# Patient Record
Sex: Female | Born: 1940 | Race: Black or African American | Hispanic: No | Smoking: Never smoker
Health system: Southern US, Community
[De-identification: ages and names within clinical notes are randomized; demographics above are authoritative.]

## PROBLEM LIST (undated history)

## (undated) DIAGNOSIS — G43909 Migraine, unspecified, not intractable, without status migrainosus: Secondary | ICD-10-CM

## (undated) DIAGNOSIS — I10 Essential (primary) hypertension: Secondary | ICD-10-CM

## (undated) DIAGNOSIS — K219 Gastro-esophageal reflux disease without esophagitis: Secondary | ICD-10-CM

## (undated) HISTORY — PX: HERNIA REPAIR: SHX51

---

## 2018-03-29 ENCOUNTER — Observation Stay (HOSPITAL_COMMUNITY): Payer: Medicare Other

## 2018-03-29 ENCOUNTER — Inpatient Hospital Stay (HOSPITAL_COMMUNITY)
Admission: EM | Admit: 2018-03-29 | Discharge: 2018-04-01 | DRG: 417 | Disposition: A | Discharge status: Home / Self Care | Payer: Medicare Other | Attending: Internal Medicine | Admitting: Internal Medicine

## 2018-03-29 ENCOUNTER — Other Ambulatory Visit: Payer: Self-pay

## 2018-03-29 ENCOUNTER — Encounter (HOSPITAL_COMMUNITY): Payer: Self-pay | Admitting: *Deleted

## 2018-03-29 ENCOUNTER — Emergency Department (HOSPITAL_COMMUNITY): Payer: Medicare Other

## 2018-03-29 DIAGNOSIS — Z7982 Long term (current) use of aspirin: Secondary | ICD-10-CM

## 2018-03-29 DIAGNOSIS — R7989 Other specified abnormal findings of blood chemistry: Secondary | ICD-10-CM

## 2018-03-29 DIAGNOSIS — G43809 Other migraine, not intractable, without status migrainosus: Secondary | ICD-10-CM

## 2018-03-29 DIAGNOSIS — K219 Gastro-esophageal reflux disease without esophagitis: Secondary | ICD-10-CM | POA: Diagnosis present

## 2018-03-29 DIAGNOSIS — B9620 Unspecified Escherichia coli [E. coli] as the cause of diseases classified elsewhere: Secondary | ICD-10-CM | POA: Diagnosis present

## 2018-03-29 DIAGNOSIS — K660 Peritoneal adhesions (postprocedural) (postinfection): Secondary | ICD-10-CM | POA: Diagnosis present

## 2018-03-29 DIAGNOSIS — K851 Biliary acute pancreatitis without necrosis or infection: Secondary | ICD-10-CM | POA: Diagnosis present

## 2018-03-29 DIAGNOSIS — K8020 Calculus of gallbladder without cholecystitis without obstruction: Principal | ICD-10-CM | POA: Diagnosis present

## 2018-03-29 DIAGNOSIS — R7881 Bacteremia: Secondary | ICD-10-CM | POA: Diagnosis present

## 2018-03-29 DIAGNOSIS — I10 Essential (primary) hypertension: Secondary | ICD-10-CM | POA: Diagnosis present

## 2018-03-29 DIAGNOSIS — E876 Hypokalemia: Secondary | ICD-10-CM | POA: Diagnosis present

## 2018-03-29 DIAGNOSIS — R52 Pain, unspecified: Secondary | ICD-10-CM

## 2018-03-29 DIAGNOSIS — Z79899 Other long term (current) drug therapy: Secondary | ICD-10-CM | POA: Diagnosis not present

## 2018-03-29 DIAGNOSIS — K859 Acute pancreatitis without necrosis or infection, unspecified: Secondary | ICD-10-CM

## 2018-03-29 DIAGNOSIS — G43909 Migraine, unspecified, not intractable, without status migrainosus: Secondary | ICD-10-CM | POA: Diagnosis present

## 2018-03-29 DIAGNOSIS — R945 Abnormal results of liver function studies: Secondary | ICD-10-CM

## 2018-03-29 HISTORY — DX: Gastro-esophageal reflux disease without esophagitis: K21.9

## 2018-03-29 HISTORY — DX: Essential (primary) hypertension: I10

## 2018-03-29 HISTORY — DX: Migraine, unspecified, not intractable, without status migrainosus: G43.909

## 2018-03-29 LAB — COMPREHENSIVE METABOLIC PANEL
ALT: 53 U/L — AB (ref 0–44)
AST: 97 U/L — ABNORMAL HIGH (ref 15–41)
Albumin: 4.3 g/dL (ref 3.5–5.0)
Alkaline Phosphatase: 48 U/L (ref 38–126)
Anion gap: 13 (ref 5–15)
BUN: 33 mg/dL — ABNORMAL HIGH (ref 8–23)
CALCIUM: 9.3 mg/dL (ref 8.9–10.3)
CHLORIDE: 102 mmol/L (ref 98–111)
CO2: 24 mmol/L (ref 22–32)
CREATININE: 0.94 mg/dL (ref 0.44–1.00)
GFR calc Af Amer: >60 mL/min (ref >60)
GFR calc non Af Amer: 56 mL/min — ABNORMAL LOW (ref >60)
Glucose, Bld: 147 mg/dL — ABNORMAL HIGH (ref 70–99)
Potassium: 4.2 mmol/L (ref 3.5–5.1)
SODIUM: 139 mmol/L (ref 135–145)
Total Bilirubin: 1.2 mg/dL (ref 0.3–1.2)
Total Protein: 7.9 g/dL (ref 6.5–8.1)

## 2018-03-29 LAB — URINALYSIS, ROUTINE W REFLEX MICROSCOPIC
Bacteria, UA: NONE SEEN
Bilirubin Urine: NEGATIVE
GLUCOSE, UA: NEGATIVE mg/dL
Ketones, ur: NEGATIVE mg/dL
Nitrite: NEGATIVE
Protein, ur: NEGATIVE mg/dL
SPECIFIC GRAVITY, URINE: 1.035 — AB (ref 1.005–1.030)
pH: 5 (ref 5.0–8.0)

## 2018-03-29 LAB — LIPASE, BLOOD: LIPASE: 1089 U/L — AB (ref 11–51)

## 2018-03-29 LAB — CBC
HCT: 35.2 % — ABNORMAL LOW (ref 36.0–46.0)
Hemoglobin: 11.9 g/dL — ABNORMAL LOW (ref 12.0–15.0)
MCH: 29.4 pg (ref 26.0–34.0)
MCHC: 33.8 g/dL (ref 30.0–36.0)
MCV: 86.9 fL (ref 78.0–100.0)
PLATELETS: 267 10*3/uL (ref 150–400)
RBC: 4.05 MIL/uL (ref 3.87–5.11)
RDW: 13.4 % (ref 11.5–15.5)
WBC: 7.2 10*3/uL (ref 4.0–10.5)

## 2018-03-29 LAB — LIPID PANEL
Cholesterol: 183 mg/dL (ref 0–200)
HDL: 77 mg/dL (ref >40)
LDL CALC: 103 mg/dL — AB (ref 0–99)
TRIGLYCERIDES: 17 mg/dL (ref <150)
Total CHOL/HDL Ratio: 2.4 RATIO
VLDL: 3 mg/dL (ref 0–40)

## 2018-03-29 MED ORDER — IOPAMIDOL (ISOVUE-300) INJECTION 61%
100.0000 mL | Freq: Once | INTRAVENOUS | Status: AC | PRN
  Administered 2018-03-29: 100 mL via INTRAVENOUS

## 2018-03-29 MED ORDER — SODIUM CHLORIDE 0.9 % IV BOLUS
500.0000 mL | Freq: Once | INTRAVENOUS | Status: AC
  Administered 2018-03-29: 500 mL via INTRAVENOUS

## 2018-03-29 MED ORDER — SODIUM CHLORIDE 0.9 % IV SOLN: 250.0000 mL | INTRAVENOUS | Status: DC | PRN

## 2018-03-29 MED ORDER — SODIUM CHLORIDE 0.9% FLUSH
3.0000 mL | Freq: Two times a day (BID) | INTRAVENOUS | Status: DC
  Administered 2018-03-29 – 2018-03-31 (×4): 3 mL via INTRAVENOUS

## 2018-03-29 MED ORDER — ONDANSETRON HCL 4 MG/2ML IJ SOLN
4.0000 mg | Freq: Four times a day (QID) | INTRAMUSCULAR | Status: DC | PRN
  Administered 2018-03-29 – 2018-03-30 (×3): 4 mg via INTRAVENOUS
  Filled 2018-03-29 (×3): qty 2

## 2018-03-29 MED ORDER — HYDRALAZINE HCL 20 MG/ML IJ SOLN: 10.0000 mg | Freq: Three times a day (TID) | INTRAMUSCULAR | Status: DC | PRN

## 2018-03-29 MED ORDER — SODIUM CHLORIDE 0.9 % IV BOLUS
1000.0000 mL | Freq: Once | INTRAVENOUS | Status: AC
  Administered 2018-03-29: 1000 mL via INTRAVENOUS

## 2018-03-29 MED ORDER — SODIUM CHLORIDE 0.9% FLUSH: 3.0000 mL | INTRAVENOUS | Status: DC | PRN

## 2018-03-29 MED ORDER — MORPHINE SULFATE (PF) 2 MG/ML IV SOLN
2.0000 mg | INTRAVENOUS | Status: DC | PRN
  Administered 2018-03-29 – 2018-03-30 (×3): 2 mg via INTRAVENOUS
  Filled 2018-03-29 (×3): qty 1

## 2018-03-29 MED ORDER — IOPAMIDOL (ISOVUE-300) INJECTION 61%
INTRAVENOUS | Status: AC
  Filled 2018-03-29: qty 100

## 2018-03-29 MED ORDER — ACETAMINOPHEN 650 MG RE SUPP: 650.0000 mg | Freq: Four times a day (QID) | RECTAL | Status: DC | PRN

## 2018-03-29 MED ORDER — FAMOTIDINE IN NACL 20-0.9 MG/50ML-% IV SOLN
20.0000 mg | Freq: Two times a day (BID) | INTRAVENOUS | Status: DC
  Administered 2018-03-29 – 2018-03-31 (×5): 20 mg via INTRAVENOUS
  Filled 2018-03-29 (×5): qty 50

## 2018-03-29 MED ORDER — SODIUM CHLORIDE 0.9 % IV SOLN
INTRAVENOUS | Status: DC
  Administered 2018-03-29 – 2018-03-30 (×3): via INTRAVENOUS

## 2018-03-29 MED ORDER — TRAMADOL HCL 50 MG PO TABS: 50.0000 mg | ORAL_TABLET | Freq: Four times a day (QID) | ORAL | Status: DC | PRN

## 2018-03-29 MED ORDER — ENOXAPARIN SODIUM 40 MG/0.4ML ~~LOC~~ SOLN
40.0000 mg | SUBCUTANEOUS | Status: DC
  Administered 2018-03-29: 40 mg via SUBCUTANEOUS
  Filled 2018-03-29: qty 0.4

## 2018-03-29 MED ORDER — SENNOSIDES-DOCUSATE SODIUM 8.6-50 MG PO TABS: 1.0000 | ORAL_TABLET | Freq: Every evening | ORAL | Status: DC | PRN

## 2018-03-29 MED ORDER — ACETAMINOPHEN 325 MG PO TABS
650.0000 mg | ORAL_TABLET | Freq: Four times a day (QID) | ORAL | Status: DC | PRN
  Administered 2018-03-29 – 2018-03-30 (×2): 650 mg via ORAL
  Filled 2018-03-29 (×2): qty 2

## 2018-03-29 MED ORDER — ONDANSETRON HCL 4 MG PO TABS: 4.0000 mg | ORAL_TABLET | Freq: Four times a day (QID) | ORAL | Status: DC | PRN

## 2018-03-29 NOTE — ED Notes (Signed)
Patient attempted to give urine sample but missed the hat entirely. Still awaiting urine sample.

## 2018-03-29 NOTE — ED Notes (Signed)
Pt had to use the restroom prior to triage, ambulatory independently for the same.

## 2018-03-29 NOTE — H&P (Addendum)
History and Physical  Kindle Strohmeier ZOX:[id number] DOB: 11-Jul-1941 DOA: 03/29/2018    PCP: System, Pcp Not In  Patient coming from: Home  Chief Complaint: Abdominal pain and vomiting  HPI: Jody Cordova is a 77 y.o. female with medical history significant for hypertension, migraine, GERD presents to the ED complaining of abdominal pain and non-bloody vomiting for the past 1 day.  Abdominal pain is located in the epigastric region, sharp, radiating to the back with associated nausea and nonbloody vomiting.  Patient has never had this issue before.  Patient is from [place] visiting her daughter here.  Patient denies any alcohol use.  Is any chest pain, shortness of breath, fever/chills, dizziness.   ED Course: Vital signs remained stable, lipase elevated 1089, AST 97, ALT 53.  CT abdomen showed acute pancreatitis without peripancreatic fluid collection or evidence of pancreatic necrosis.  Review of Systems: Review of systems are otherwise negative   Past Medical History:  Diagnosis Date  . GERD (gastroesophageal reflux disease)   . Hypertension   . Migraine     Social History:  reports that she has never smoked. She does not have any smokeless tobacco history on file. She reports that she does not drink alcohol or use drugs.   No Known Allergies  No family history on file.    Prior to Admission medications   Medication Sig Start Date End Date Taking? Authorizing Provider  alendronate (FOSAMAX) 70 MG tablet Take 70 mg by mouth every Monday. Take with a full glass of water on an empty stomach.   Yes [provider]  alum & mag hydroxide-simeth (MAALOX/MYLANTA) 200-200-20 MG/5ML suspension Take 30 mLs by mouth every 6 (six) hours as needed for indigestion or heartburn.   Yes [provider]  aspirin EC 81 MG tablet Take 81 mg by mouth daily.   Yes [provider]  cetirizine (ZYRTEC) 10 MG tablet Take 10 mg by mouth daily.   Yes [provider]  fluticasone (FLONASE) 50 MCG/ACT nasal spray Place 1 spray into both nostrils daily as needed for allergies or rhinitis.   Yes [provider]  hydrOXYzine (ATARAX/VISTARIL) 25 MG tablet Take 25 mg by mouth 2 (two) times daily as needed for anxiety or itching.   Yes [provider]  lisinopril-hydrochlorothiazide (PRINZIDE,ZESTORETIC) 20-25 MG tablet Take 1 tablet by mouth daily.   Yes [provider]  meclizine (ANTIVERT) 12.5 MG tablet Take 12.5 mg by mouth 3 (three) times daily as needed for dizziness.   Yes [provider]  pantoprazole (PROTONIX) 40 MG tablet Take 40 mg by mouth daily.   Yes [provider]  verapamil (CALAN) 40 MG tablet Take 40 mg by mouth daily.   Yes [provider]    Physical Exam: BP 125/80 (BP Location: Right Arm)   Pulse 92   Temp 98.4 F (36.9 C) (Oral)   Resp 18   Ht 5\' 7"  (1.702 m)   Wt 84.5 kg   SpO2 99%   BMI 29.16 kg/m   General: NAD Eyes: Normal ENT: Normal Neck: Supple Cardiovascular: S1, S2 present Respiratory: CTAB Abdomen: Soft, currently nontender, nondistended, bowel sounds present Skin: Normal Musculoskeletal: No pedal edema bilaterally Psychiatric: Normal mood Neurologic: No focal neurologic deficits noted          Labs on Admission:  Basic Metabolic Panel: Recent Labs  Lab 03/29/18 0253  NA 139  K 4.2  CL 102  CO2 24  GLUCOSE 147*  BUN 33*  CREATININE 0.94  CALCIUM 9.3   Liver Function Tests: Recent Labs  Lab 03/29/18 0253  AST 97*  ALT 53*  ALKPHOS 48  BILITOT 1.2  PROT 7.9  ALBUMIN 4.3   Recent Labs  Lab 03/29/18 0253  LIPASE 1,089*   No results for input(s): AMMONIA in the last 168 hours. CBC: Recent Labs  Lab 03/29/18 0253  WBC 7.2  HGB 11.9*  HCT 35.2*  MCV 86.9  PLT 267   Cardiac Enzymes: No results for input(s): CKTOTAL, CKMB, CKMBINDEX, TROPONINI in the last 168 hours.  BNP (last 3 results) No results for input(s): BNP in the  last 8760 hours.  ProBNP (last 3 results) No results for input(s): PROBNP in the last 8760 hours.  CBG: No results for input(s): GLUCAP in the last 168 hours.  Radiological Exams on Admission: Ct Abdomen Pelvis W Contrast  Result Date: 03/29/2018 CLINICAL DATA:  Acute abdominal pain.  Nausea and vomiting. EXAM: CT ABDOMEN AND PELVIS WITH CONTRAST TECHNIQUE: Multidetector CT imaging of the abdomen and pelvis was performed using the standard protocol following bolus administration of intravenous contrast. CONTRAST:  ISOVUE-300 IOPAMIDOL (ISOVUE-300) INJECTION 61% COMPARISON:  None. FINDINGS: Lower chest: Mild right lower lobe bronchiectasis and basilar scarring. No consolidation. No pleural fluid. Hepatobiliary: Few scattered subcentimeter hepatic hypodensities are too small to accurately characterize. Mild gallbladder distention with layering hyperdensity, small stones versus sludge. No biliary dilatation. Pancreas: Mild peripancreatic fat stranding about the pancreatic head. No evidence pancreatic necrosis, ductal dilatation, or acute peripancreatic fluid collection. Spleen: Normal in size without focal abnormality. Adrenals/Urinary Tract: 3.4 cm low-density left adrenal nodule consistent with adenoma. Right adrenal gland is normal. No hydronephrosis or perinephric edema. Early excretion of IV contrast in the renal collecting systems bilaterally. Urinary bladder is physiologically distended. No bladder wall thickening. Stomach/Bowel: Small hiatal hernia. Detailed bowel evaluation limited in the absence of enteric contrast. Multifocal colonic diverticulosis without diverticulitis. Normal appendix. No small bowel dilatation or inflammation. Vascular/Lymphatic: Minimal aortic atherosclerosis without aneurysm. Splenic vein is patent. No enlarged abdominal or pelvic lymph nodes. Reproductive: Post hysterectomy. Ovaries tentatively identified and quiescent. No suspicious adnexal mass. Other: No free air,  free fluid, or intra-abdominal fluid collection. Musculoskeletal: Scoliotic curvature in degenerative change in the spine. IMPRESSION: 1. Acute pancreatitis without peripancreatic fluid collection or evidence of pancreatic necrosis. 2. Layering stones or sludge in the gallbladder. 3. Multifocal colonic diverticulosis without diverticulitis. 4. Small hiatal hernia.  Incidental left adrenal adenoma. Electronically Signed   By: Narda Rutherford M.D.   On: 03/29/2018 05:39    EKG: Pending  Assessment/Plan Present on Admission: . Pancreatitis  Principal Problem:   Pancreatitis Active Problems:   GERD (gastroesophageal reflux disease)   Essential hypertension   Migraine  Acute pancreatitis Lipase elevated at 1089, AST 97, ALT 53 Denies any alcohol use, ??On HCTZ, lipid panel pending CT abdomen showed acute pancreatitis without peripancreatic fluid collection or evidence of pancreatic necrosis.  Mild gallbladder distention with layering hyperdensity, small stones versus sludge.  No biliary dilatation. Will order ultrasound for further evaluation Aggressive IV fluids Hold hydrochlorothiazide Pain management  GERD Continue IV Pepcid  Hypertension Stable Hold home lisinopril, hydrochlorothiazide (may consider stopping it upon d/c if no other cause of pancreatitis is found)  Migraine Hold home verapamil for now as patient is n.p.o.     DVT prophylaxis: Lovenox  Code Status: Full  Family Communication: None at bedside  Disposition Plan: Home  Consults called: None  Admission status: Observation  Briant Cedar MD [facility]   If 7PM-7AM, please contact night-coverage [email or web address]   03/29/2018, 7:59 AM

## 2018-03-29 NOTE — Plan of Care (Addendum)
  Discussed case with Dr. Judd Lien. Jody Cordova is a 77 year old female with pmh HTN, migraine, and GERD; who presented with abdominal pain and vomiting.  Labs revealed lipase 1089, AST 97, ALT 53.  CT scan of the abdomen and pelvis revealed acute pancreatitis without signs of fluid collection and layering stones or sludge in gallbladder.  Patient was given 500 mL normal saline IV fluids.  Accepted as observation to telemetry bed.

## 2018-03-29 NOTE — Care Management Obs Status (Signed)
MEDICARE OBSERVATION STATUS NOTIFICATION   Patient Details  Name: Jody Cordova MRN: [id number] Date of Birth: 1941/07/10   Medicare Observation Status Notification Given:   yes    Geni Bers, RN 03/29/2018, 12:55 PM

## 2018-03-29 NOTE — ED Notes (Signed)
ED TO INPATIENT HANDOFF REPORT  Name/Age/Gender Jody Cordova 77 y.o. female  Code Status   Home/SNF/Other Home  Chief Complaint Abdominal Pain; Emesis  Level of Care/Admitting Diagnosis ED Disposition    ED Disposition Condition Comment   Admit  Hospital Area: [facility] [[id number]]  Level of Care: Telemetry [5]  Admit to tele based on following criteria: Complex arrhythmia (Bradycardia/Tachycardia)  Diagnosis: Pancreatitis [284132]  Admitting Physician: Norval Morton [[id number]]  Attending Physician: Norval Morton [[id number]]  PT Class (Do Not Modify): Observation [104]  PT Acc Code (Do Not Modify): Observation [10022]       Medical History Past Medical History:  Diagnosis Date  . GERD (gastroesophageal reflux disease)   . Hypertension   . Migraine     Allergies No Known Allergies  IV Location/Drains/Wounds Patient Lines/Drains/Airways Status   Active Line/Drains/Airways    Name:   Placement date:   Placement time:   Site:   Days:   Peripheral IV 03/29/18 Left Antecubital   03/29/18    0257    Antecubital   less than 1          Labs/Imaging Results for orders placed or performed during the hospital encounter of 03/29/18 (from the past 48 hour(s))  Lipase, blood     Status: Abnormal   Collection Time: 03/29/18  2:53 AM  Result Value Ref Range   Lipase 1,089 (H) 11 - 51 U/L    Comment: RESULTS CONFIRMED BY MANUAL DILUTION Performed at [facility], [place] [address], [place], [place] [address]   Comprehensive metabolic panel     Status: Abnormal   Collection Time: 03/29/18  2:53 AM  Result Value Ref Range   Sodium 139 135 - 145 mmol/L   Potassium 4.2 3.5 - 5.1 mmol/L    Comment: SLIGHT HEMOLYSIS   Chloride 102 98 - 111 mmol/L   CO2 24 22 - 32 mmol/L   Glucose, Bld 147 (H) 70 - 99 mg/dL   BUN 33 (H) 8 - 23 mg/dL   Creatinine, Ser 0.94 0.44 - 1.00 mg/dL   Calcium 9.3 8.9 - 10.3 mg/dL   Total Protein 7.9 6.5 -  8.1 g/dL   Albumin 4.3 3.5 - 5.0 g/dL   AST 97 (H) 15 - 41 U/L   ALT 53 (H) 0 - 44 U/L   Alkaline Phosphatase 48 38 - 126 U/L   Total Bilirubin 1.2 0.3 - 1.2 mg/dL   GFR calc non Af Amer 56 (L) >60 mL/min   GFR calc Af Amer >60 >60 mL/min    Comment: (NOTE) The eGFR has been calculated using the CKD EPI equation. This calculation has not been validated in all clinical situations. eGFR's persistently <60 mL/min signify possible Chronic Kidney Disease.    Anion gap 13 5 - 15    Comment: Performed at [facility], [place] [address] [place], [place] [address]  CBC     Status: Abnormal   Collection Time: 03/29/18  2:53 AM  Result Value Ref Range   WBC 7.2 4.0 - 10.5 K/uL   RBC 4.05 3.87 - 5.11 MIL/uL   Hemoglobin 11.9 (L) 12.0 - 15.0 g/dL   HCT 35.2 (L) 36.0 - 46.0 %   MCV 86.9 78.0 - 100.0 fL   MCH 29.4 26.0 - 34.0 pg   MCHC 33.8 30.0 - 36.0 g/dL   RDW 13.4 11.5 - 15.5 %   Platelets 267 150 - 400 K/uL    Comment:  Performed at [facility], [place] [address] [place], [place] [address]   Ct Abdomen Pelvis W Contrast  Result Date: 03/29/2018 CLINICAL DATA:  Acute abdominal pain.  Nausea and vomiting. EXAM: CT ABDOMEN AND PELVIS WITH CONTRAST TECHNIQUE: Multidetector CT imaging of the abdomen and pelvis was performed using the standard protocol following bolus administration of intravenous contrast. CONTRAST:  139m ISOVUE-300 IOPAMIDOL (ISOVUE-300) INJECTION 61% COMPARISON:  None. FINDINGS: Lower chest: Mild right lower lobe bronchiectasis and basilar scarring. No consolidation. No pleural fluid. Hepatobiliary: Few scattered subcentimeter hepatic hypodensities are too small to accurately characterize. Mild gallbladder distention with layering hyperdensity, small stones versus sludge. No biliary dilatation. Pancreas: Mild peripancreatic fat stranding about the pancreatic head. No evidence pancreatic necrosis, ductal dilatation, or acute peripancreatic fluid  collection. Spleen: Normal in size without focal abnormality. Adrenals/Urinary Tract: 3.4 cm low-density left adrenal nodule consistent with adenoma. Right adrenal gland is normal. No hydronephrosis or perinephric edema. Early excretion of IV contrast in the renal collecting systems bilaterally. Urinary bladder is physiologically distended. No bladder wall thickening. Stomach/Bowel: Small hiatal hernia. Detailed bowel evaluation limited in the absence of enteric contrast. Multifocal colonic diverticulosis without diverticulitis. Normal appendix. No small bowel dilatation or inflammation. Vascular/Lymphatic: Minimal aortic atherosclerosis without aneurysm. Splenic vein is patent. No enlarged abdominal or pelvic lymph nodes. Reproductive: Post hysterectomy. Ovaries tentatively identified and quiescent. No suspicious adnexal mass. Other: No free air, free fluid, or intra-abdominal fluid collection. Musculoskeletal: Scoliotic curvature in degenerative change in the spine. IMPRESSION: 1. Acute pancreatitis without peripancreatic fluid collection or evidence of pancreatic necrosis. 2. Layering stones or sludge in the gallbladder. 3. Multifocal colonic diverticulosis without diverticulitis. 4. Small hiatal hernia.  Incidental left adrenal adenoma. Electronically Signed   By: MKeith RakeM.D.   On: 03/29/2018 05:39    Pending Labs Unresulted Labs (From admission, onward)    Start     Ordered   03/29/18 0159  Urinalysis, Routine w reflex microscopic  STAT,   STAT     03/29/18 0158          Vitals/Pain Today's Vitals   03/29/18 0157 03/29/18 0546 03/29/18 0630  BP: 127/76 (!) 141/110 118/65  Pulse: 64 95 90  Resp: 18 16   Temp: 97.9 F (36.6 C)    TempSrc: Oral    SpO2: 100% 100% 100%    Isolation Precautions No active isolations  Medications Medications  iopamidol (ISOVUE-300) 61 % injection (has no administration in time range)  0.9 %  sodium chloride infusion (has no administration in  time range)  sodium chloride 0.9 % bolus 500 mL (0 mLs Intravenous Stopped 03/29/18 0517)  iopamidol (ISOVUE-300) 61 % injection 100 mL (100 mLs Intravenous Contrast Given 03/29/18 0453)    Mobility walks

## 2018-03-29 NOTE — ED Provider Notes (Signed)
[facility]-EMERGENCY DEPT Provider Note   CSN: [id number] Arrival date & time: 03/29/18  0056     History   Chief Complaint Chief Complaint  Patient presents with  . Emesis    HPI Jody Cordova is a 77 y.o. female.  Patient is a 77 year old female with past medical history of GERD, hypertension.  She presents today for evaluation of vomiting and abdominal pain.  This started earlier this evening.  She reports several episodes of vomiting and upper abdominal cramping.  She denies any fevers or chills.  She denies any diarrhea or constipation.  She denies any ill contacts.  The history is provided by the patient.  Emesis   This is a new problem. The current episode started yesterday. The problem occurs continuously. The problem has been gradually improving. There has been no fever. Associated symptoms include abdominal pain. Pertinent negatives include no chills, no diarrhea and no fever.    Past Medical History:  Diagnosis Date  . GERD (gastroesophageal reflux disease)   . Hypertension   . Migraine     There are no active problems to display for this patient.      OB History   None      Home Medications    Prior to Admission medications   Not on File    Family History No family history on file.  Social History Social History   Tobacco Use  . Smoking status: Never Smoker  Substance Use Topics  . Alcohol use: Never    Frequency: Never  . Drug use: Never     Allergies   Patient has no known allergies.   Review of Systems Review of Systems  Constitutional: Negative for chills and fever.  Gastrointestinal: Positive for abdominal pain and vomiting. Negative for diarrhea.  All other systems reviewed and are negative.    Physical Exam Updated Vital Signs BP 127/76 (BP Location: Right Arm)   Pulse 64   Temp 97.9 F (36.6 C) (Oral)   Resp 18   SpO2 100%   Physical Exam  Constitutional: She is oriented to person, place,  and time. She appears well-developed and well-nourished. No distress.  HENT:  Head: Normocephalic and atraumatic.  Neck: Normal range of motion. Neck supple.  Cardiovascular: Normal rate and regular rhythm. Exam reveals no gallop and no friction rub.  No murmur heard. Pulmonary/Chest: Effort normal and breath sounds normal. No respiratory distress. She has no wheezes.  Abdominal: Soft. Bowel sounds are normal. She exhibits no distension. There is no tenderness.  Musculoskeletal: Normal range of motion.  Neurological: She is alert and oriented to person, place, and time.  Skin: Skin is warm and dry. She is not diaphoretic.  Nursing note and vitals reviewed.    ED Treatments / Results  Labs (all labs ordered are listed, but only abnormal results are displayed) Labs Reviewed  LIPASE, BLOOD - Abnormal; Notable for the following components:      Result Value   Lipase 1,089 (*)    All other components within normal limits  COMPREHENSIVE METABOLIC PANEL - Abnormal; Notable for the following components:   Glucose, Bld 147 (*)    BUN 33 (*)    AST 97 (*)    ALT 53 (*)    GFR calc non Af Amer 56 (*)    All other components within normal limits  CBC - Abnormal; Notable for the following components:   Hemoglobin 11.9 (*)    HCT 35.2 (*)  All other components within normal limits  URINALYSIS, ROUTINE W REFLEX MICROSCOPIC    EKG None  Radiology No results found.  Procedures Procedures (including critical care time)  Medications Ordered in ED Medications  sodium chloride 0.9 % bolus 500 mL (has no administration in time range)     Initial Impression / Assessment and Plan / ED Course  I have reviewed the triage vital signs and the nursing notes.  Pertinent labs & imaging results that were available during my care of the patient were reviewed by me and considered in my medical decision making (see chart for details).  Patient presents with complaints of epigastric pain,  nausea, and vomiting.  A CT scan was obtained as well as laboratory studies.  CT scan reveals acute pancreatitis and her lipase is nearly 1100.  I am uncertain as to the exact etiology of her pancreatitis.  She will be admitted to the hospitalist service for intravenous fluids, pain control, and further work-up.  Dr. Katrinka Blazing agrees to admit.  Final Clinical Impressions(s) / ED Diagnoses   Final diagnoses:  None    ED Discharge Orders    None       Geoffery Lyons, MD 03/29/18 2314

## 2018-03-30 ENCOUNTER — Encounter (HOSPITAL_COMMUNITY): Payer: Self-pay | Admitting: General Surgery

## 2018-03-30 ENCOUNTER — Encounter (HOSPITAL_COMMUNITY)
Admission: EM | Disposition: A | Discharge status: Home / Self Care | Payer: Self-pay | Source: Home / Self Care | Attending: Internal Medicine

## 2018-03-30 ENCOUNTER — Inpatient Hospital Stay (HOSPITAL_COMMUNITY): Payer: Medicare Other | Admitting: Anesthesiology

## 2018-03-30 ENCOUNTER — Other Ambulatory Visit: Payer: Self-pay

## 2018-03-30 ENCOUNTER — Inpatient Hospital Stay (HOSPITAL_COMMUNITY): Payer: Medicare Other

## 2018-03-30 DIAGNOSIS — K859 Acute pancreatitis without necrosis or infection, unspecified: Secondary | ICD-10-CM

## 2018-03-30 HISTORY — PX: CHOLECYSTECTOMY: SHX55

## 2018-03-30 LAB — LIPASE, BLOOD: LIPASE: 95 U/L — AB (ref 11–51)

## 2018-03-30 LAB — CBC
HCT: 30 % — ABNORMAL LOW (ref 36.0–46.0)
HEMOGLOBIN: 10 g/dL — AB (ref 12.0–15.0)
MCH: 29 pg (ref 26.0–34.0)
MCHC: 33.3 g/dL (ref 30.0–36.0)
MCV: 87 fL (ref 78.0–100.0)
PLATELETS: 182 10*3/uL (ref 150–400)
RBC: 3.45 MIL/uL — ABNORMAL LOW (ref 3.87–5.11)
RDW: 13.8 % (ref 11.5–15.5)
WBC: 5.4 10*3/uL (ref 4.0–10.5)

## 2018-03-30 LAB — COMPREHENSIVE METABOLIC PANEL
ALT: 40 U/L (ref 0–44)
ANION GAP: 9 (ref 5–15)
AST: 34 U/L (ref 15–41)
Albumin: 3.3 g/dL — ABNORMAL LOW (ref 3.5–5.0)
Alkaline Phosphatase: 38 U/L (ref 38–126)
BUN: 18 mg/dL (ref 8–23)
CHLORIDE: 105 mmol/L (ref 98–111)
CO2: 26 mmol/L (ref 22–32)
CREATININE: 0.85 mg/dL (ref 0.44–1.00)
Calcium: 8.3 mg/dL — ABNORMAL LOW (ref 8.9–10.3)
GFR calc Af Amer: >60 mL/min (ref >60)
Glucose, Bld: 124 mg/dL — ABNORMAL HIGH (ref 70–99)
Potassium: 3 mmol/L — ABNORMAL LOW (ref 3.5–5.1)
SODIUM: 140 mmol/L (ref 135–145)
Total Bilirubin: 1.3 mg/dL — ABNORMAL HIGH (ref 0.3–1.2)
Total Protein: 6.3 g/dL — ABNORMAL LOW (ref 6.5–8.1)

## 2018-03-30 LAB — MRSA PCR SCREENING: MRSA by PCR: NEGATIVE

## 2018-03-30 LAB — BLOOD CULTURE ID PANEL (REFLEXED)

## 2018-03-30 LAB — MAGNESIUM: Magnesium: 1.6 mg/dL — ABNORMAL LOW (ref 1.7–2.4)

## 2018-03-30 SURGERY — LAPAROSCOPIC CHOLECYSTECTOMY WITH INTRAOPERATIVE CHOLANGIOGRAM
Anesthesia: General | Site: Abdomen

## 2018-03-30 MED ORDER — SODIUM CHLORIDE 0.9 % IV SOLN: INTRAVENOUS | Status: DC

## 2018-03-30 MED ORDER — SUGAMMADEX SODIUM 200 MG/2ML IV SOLN
INTRAVENOUS | Status: AC
  Filled 2018-03-30: qty 2

## 2018-03-30 MED ORDER — ROCURONIUM BROMIDE 10 MG/ML (PF) SYRINGE
PREFILLED_SYRINGE | INTRAVENOUS | Status: AC
  Filled 2018-03-30: qty 10

## 2018-03-30 MED ORDER — BUPIVACAINE-EPINEPHRINE (PF) 0.5% -1:200000 IJ SOLN
INTRAMUSCULAR | Status: AC
  Filled 2018-03-30: qty 30

## 2018-03-30 MED ORDER — BUPIVACAINE HCL (PF) 0.25 % IJ SOLN
INTRAMUSCULAR | Status: AC
  Filled 2018-03-30: qty 30

## 2018-03-30 MED ORDER — SODIUM CHLORIDE 0.9 % IV SOLN
2.0000 g | INTRAVENOUS | Status: DC
  Administered 2018-03-31: 2 g via INTRAVENOUS
  Filled 2018-03-30: qty 2
  Filled 2018-03-30: qty 20

## 2018-03-30 MED ORDER — ONDANSETRON HCL 4 MG/2ML IJ SOLN
INTRAMUSCULAR | Status: AC
  Filled 2018-03-30: qty 2

## 2018-03-30 MED ORDER — FENTANYL CITRATE (PF) 100 MCG/2ML IJ SOLN
INTRAMUSCULAR | Status: DC | PRN
  Administered 2018-03-30 (×2): 50 ug via INTRAVENOUS

## 2018-03-30 MED ORDER — IOPAMIDOL (ISOVUE-300) INJECTION 61%
INTRAVENOUS | Status: AC
  Filled 2018-03-30: qty 50

## 2018-03-30 MED ORDER — FENTANYL CITRATE (PF) 100 MCG/2ML IJ SOLN
25.0000 ug | INTRAMUSCULAR | Status: DC | PRN
  Administered 2018-03-30 (×2): 25 ug via INTRAVENOUS

## 2018-03-30 MED ORDER — DEXAMETHASONE SODIUM PHOSPHATE 10 MG/ML IJ SOLN
INTRAMUSCULAR | Status: DC | PRN
  Administered 2018-03-30: 10 mg via INTRAVENOUS

## 2018-03-30 MED ORDER — BUPIVACAINE HCL (PF) 0.25 % IJ SOLN
INTRAMUSCULAR | Status: DC | PRN
  Administered 2018-03-30: 25 mL

## 2018-03-30 MED ORDER — POTASSIUM CHLORIDE 10 MEQ/100ML IV SOLN
10.0000 meq | INTRAVENOUS | Status: AC
  Administered 2018-03-30 (×4): 10 meq via INTRAVENOUS
  Filled 2018-03-30 (×6): qty 100

## 2018-03-30 MED ORDER — ENOXAPARIN SODIUM 40 MG/0.4ML ~~LOC~~ SOLN
40.0000 mg | SUBCUTANEOUS | Status: DC
  Administered 2018-03-31 – 2018-04-01 (×2): 40 mg via SUBCUTANEOUS
  Filled 2018-03-30 (×2): qty 0.4

## 2018-03-30 MED ORDER — SUGAMMADEX SODIUM 200 MG/2ML IV SOLN
INTRAVENOUS | Status: DC | PRN
  Administered 2018-03-30: 200 mg via INTRAVENOUS

## 2018-03-30 MED ORDER — LACTATED RINGERS IV SOLN
INTRAVENOUS | Status: DC
  Administered 2018-03-30 (×2): via INTRAVENOUS

## 2018-03-30 MED ORDER — SODIUM CHLORIDE 0.9 % IV SOLN
2.0000 g | INTRAVENOUS | Status: AC
  Administered 2018-03-30: 2 g via INTRAVENOUS
  Filled 2018-03-30: qty 20

## 2018-03-30 MED ORDER — DEXAMETHASONE SODIUM PHOSPHATE 10 MG/ML IJ SOLN
INTRAMUSCULAR | Status: AC
  Filled 2018-03-30: qty 1

## 2018-03-30 MED ORDER — LIDOCAINE 2% (20 MG/ML) 5 ML SYRINGE
INTRAMUSCULAR | Status: DC | PRN
  Administered 2018-03-30: 100 mg via INTRAVENOUS

## 2018-03-30 MED ORDER — LACTATED RINGERS IV SOLN
INTRAVENOUS | Status: AC | PRN
  Administered 2018-03-30: 2000 mL

## 2018-03-30 MED ORDER — ROCURONIUM BROMIDE 10 MG/ML (PF) SYRINGE
PREFILLED_SYRINGE | INTRAVENOUS | Status: DC | PRN
  Administered 2018-03-30: 50 mg via INTRAVENOUS

## 2018-03-30 MED ORDER — MAGNESIUM SULFATE 2 GM/50ML IV SOLN
2.0000 g | Freq: Once | INTRAVENOUS | Status: AC
  Administered 2018-03-30: 2 g via INTRAVENOUS
  Filled 2018-03-30: qty 50

## 2018-03-30 MED ORDER — FENTANYL CITRATE (PF) 100 MCG/2ML IJ SOLN
INTRAMUSCULAR | Status: AC
  Filled 2018-03-30: qty 2

## 2018-03-30 MED ORDER — ONDANSETRON HCL 4 MG/2ML IJ SOLN
INTRAMUSCULAR | Status: DC | PRN
  Administered 2018-03-30: 4 mg via INTRAVENOUS

## 2018-03-30 MED ORDER — PROPOFOL 10 MG/ML IV BOLUS
INTRAVENOUS | Status: DC | PRN
  Administered 2018-03-30: 160 mg via INTRAVENOUS

## 2018-03-30 MED ORDER — SUCCINYLCHOLINE CHLORIDE 200 MG/10ML IV SOSY
PREFILLED_SYRINGE | INTRAVENOUS | Status: DC | PRN
  Administered 2018-03-30: 120 mg via INTRAVENOUS

## 2018-03-30 MED ORDER — CHLORHEXIDINE GLUCONATE CLOTH 2 % EX PADS
6.0000 | MEDICATED_PAD | Freq: Every day | CUTANEOUS | Status: DC
  Administered 2018-03-30: 6 via TOPICAL

## 2018-03-30 MED ORDER — PROPOFOL 10 MG/ML IV BOLUS
INTRAVENOUS | Status: AC
  Filled 2018-03-30: qty 20

## 2018-03-30 MED ORDER — LIDOCAINE 2% (20 MG/ML) 5 ML SYRINGE
INTRAMUSCULAR | Status: AC
  Filled 2018-03-30: qty 5

## 2018-03-30 MED ORDER — HYDROCODONE-ACETAMINOPHEN 5-325 MG PO TABS: 1.0000 | ORAL_TABLET | ORAL | Status: DC | PRN

## 2018-03-30 MED ORDER — SODIUM CHLORIDE 0.9 % IV SOLN
INTRAVENOUS | Status: DC | PRN
  Administered 2018-03-30: 20 mL

## 2018-03-30 SURGICAL SUPPLY — 37 items
APPLIER CLIP 5 13 M/L LIGAMAX5 (MISCELLANEOUS) ×3
CABLE HIGH FREQUENCY MONO STRZ (ELECTRODE) ×3 IMPLANT
CHLORAPREP W/TINT 26ML (MISCELLANEOUS) ×3 IMPLANT
CLIP APPLIE 5 13 M/L LIGAMAX5 (MISCELLANEOUS) ×1 IMPLANT
COVER MAYO STAND STRL (DRAPES) ×3 IMPLANT
DERMABOND ADVANCED (GAUZE/BANDAGES/DRESSINGS) ×2
DERMABOND ADVANCED .7 DNX12 (GAUZE/BANDAGES/DRESSINGS) ×1 IMPLANT
DEVICE TROCAR PUNCTURE CLOSURE (ENDOMECHANICALS) ×3 IMPLANT
DRAPE C-ARM 42X120 X-RAY (DRAPES) ×3 IMPLANT
ELECT REM PT RETURN 15FT ADLT (MISCELLANEOUS) ×3 IMPLANT
GLOVE BIO SURGEON STRL SZ 6.5 (GLOVE) ×2 IMPLANT
GLOVE BIO SURGEONS STRL SZ 6.5 (GLOVE) ×1
GLOVE BIOGEL PI IND STRL 7.0 (GLOVE) ×1 IMPLANT
GLOVE BIOGEL PI INDICATOR 7.0 (GLOVE) ×2
GOWN STRL REUS W/TWL 2XL LVL3 (GOWN DISPOSABLE) ×3 IMPLANT
GOWN STRL REUS W/TWL XL LVL3 (GOWN DISPOSABLE) ×6 IMPLANT
HEMOSTAT SNOW SURGICEL 2X4 (HEMOSTASIS) ×3 IMPLANT
IRRIG SUCT STRYKERFLOW 2 WTIP (MISCELLANEOUS) ×3
IRRIGATION SUCT STRKRFLW 2 WTP (MISCELLANEOUS) ×1 IMPLANT
IV CATH 14GX2 1/4 (CATHETERS) ×3 IMPLANT
KIT BASIN OR (CUSTOM PROCEDURE TRAY) ×3 IMPLANT
POUCH SPECIMEN RETRIEVAL 10MM (ENDOMECHANICALS) ×3 IMPLANT
SCISSORS LAP 5X35 DISP (ENDOMECHANICALS) ×3 IMPLANT
SET CHOLANGIOGRAPH MIX (MISCELLANEOUS) ×3 IMPLANT
SLEEVE XCEL OPT CAN 5 100 (ENDOMECHANICALS) ×6 IMPLANT
SUT VIC AB 0 UR5 27 (SUTURE) ×3 IMPLANT
SUT VIC AB 2-0 SH 27 (SUTURE) ×2
SUT VIC AB 2-0 SH 27X BRD (SUTURE) ×1 IMPLANT
SUT VIC AB 4-0 PS2 18 (SUTURE) ×3 IMPLANT
SUT VICRYL 0 UR6 27IN ABS (SUTURE) ×3 IMPLANT
TOWEL OR 17X26 10 PK STRL BLUE (TOWEL DISPOSABLE) ×3 IMPLANT
TOWEL OR NON WOVEN STRL DISP B (DISPOSABLE) ×3 IMPLANT
TRAY LAPAROSCOPIC (CUSTOM PROCEDURE TRAY) ×3 IMPLANT
TROCAR ADV FIXATION 12X100MM (TROCAR) ×3 IMPLANT
TROCAR BLADELESS OPT 5 100 (ENDOMECHANICALS) ×3 IMPLANT
TROCAR XCEL BLUNT TIP 100MML (ENDOMECHANICALS) ×3 IMPLANT
TUBING INSUF HEATED (TUBING) ×3 IMPLANT

## 2018-03-30 NOTE — Progress Notes (Signed)
Overnight patient had a max temperature of 102.9 that was treated with Tylenol. Provider was notified and new order received to get blood cultures. Will continue to monitor patient.

## 2018-03-30 NOTE — Discharge Instructions (Addendum)
Please take these discharge instructions with you to your primary doctor's office so they can read about what you had done.  Patient underwent a laparoscopic cholecystectomy with intra-operative cholangiogram in [place], [place] by Dr. Romie Levee on 03-30-18.  She tolerated the procedure well.  She had this secondary to a bout of gallstone pancreatitis.  Her incisions are closed subcuticularly with dermabond present over the incisions.  She was also found to have E.coli bacteremia.  Source was never identified as she did not have a UTI, acute cholecystitis, or infectious pancreatitis.  Antibiotic treatment was managed by the hospitalist.   LAPAROSCOPIC SURGERY: POST OP INSTRUCTIONS  1. DIET: Follow a light bland diet the first 24 hours after arrival home, such as soup, liquids, crackers, etc. Be sure to include lots of fluids daily. Avoid fast food or heavy meals as your are more likely to get nauseated. Eat a low fat the next few days after surgery.  2. Take your usually prescribed home medications unless otherwise directed. 3. PAIN CONTROL:  1. Pain is best controlled by a usual combination of three different methods TOGETHER:  1. Ice/Heat 2. Over the counter pain medication 3. Prescription pain medication 2. Most patients will experience some swelling and bruising around the incisions. Ice packs or heating pads (30-60 minutes up to 6 times a day) will help. Use ice for the first few days to help decrease swelling and bruising, then switch to heat to help relax tight/sore spots and speed recovery. Some people prefer to use ice alone, heat alone, alternating between ice & heat. Experiment to what works for you. Swelling and bruising can take several weeks to resolve.  3. It is helpful to take an over-the-counter pain medication regularly for the first few weeks. Choose one of the following that works best for you:  1. Naproxen (Aleve, etc) Two 220mg  tabs twice a day 2. Ibuprofen (Advil, etc) Three  200mg  tabs four times a day (every meal & bedtime) 3. Acetaminophen (Tylenol, etc) 500-650mg  four times a day (every meal & bedtime) 4. A prescription for pain medication (such as oxycodone, hydrocodone, etc) should be given to you upon discharge. Take your pain medication as prescribed.  1. If you are having problems/concerns with the prescription medicine (does not control pain, nausea, vomiting, rash, itching, etc), please call us [phone] to see if we need to switch you to a different pain medicine that will work better for you and/or control your side effect better. 2. If you need a refill on your pain medication, please contact your pharmacy. They will contact our office to request authorization. Prescriptions will not be filled after 5 pm or on week-ends. 4. Avoid getting constipated. Between the surgery and the pain medications, it is common to experience some constipation. Increasing fluid intake and taking a fiber supplement (such as Metamucil, Citrucel, FiberCon, MiraLax, etc) 1-2 times a day regularly will usually help prevent this problem from occurring. A mild laxative (prune juice, Milk of Magnesia, MiraLax, etc) should be taken according to package directions if there are no bowel movements after 48 hours.  5. Watch out for diarrhea. If you have many loose bowel movements, simplify your diet to bland foods & liquids for a few days. Stop any stool softeners and decrease your fiber supplement. Switching to mild anti-diarrheal medications (Kayopectate, Pepto Bismol) can help. If this worsens or does not improve, please call us. 6. Wash / shower every day. You may shower over the dressings as they are  waterproof. Continue to shower over incision(s) after the dressing is off. If there is glue over the incisions try not to pick it off, let it fall off naturally. 7. Remove your waterproof bandages 2 days after surgery. You may leave the incision open to air. You may replace a dressing/Band-Aid  to cover the incision for comfort if you wish.  8. ACTIVITIES as tolerated:  1. You may resume regular (light) daily activities beginning the next day--such as daily self-care, walking, climbing stairs--gradually increasing activities as tolerated. If you can walk 30 minutes without difficulty, it is safe to try more intense activity such as jogging, treadmill, bicycling, low-impact aerobics, swimming, etc. 2. Save the most intensive and strenuous activity for last such as sit-ups, heavy lifting, contact sports, etc Refrain from any heavy lifting or straining until you are off narcotics for pain control. For the first 2-3 weeks do not lift over 10-15lb.  3. DO NOT PUSH THROUGH PAIN. Let pain be your guide: If it hurts to do something, don't do it. Pain is your body warning you to avoid that activity for another week until the pain goes down. 4. You may drive when you are no longer taking prescription pain medication, you can comfortably wear a seatbelt, and you can safely maneuver your car and apply brakes. 5. You may have sexual intercourse when it is comfortable.  9. FOLLOW UP in our office  1. Please call [facility] at [phone] to set up an appointment to see your surgeon in the office for a follow-up appointment approximately 2-3 weeks after your surgery. 2. Make sure that you call for this appointment the day you arrive home to insure a convenient appointment time.      10. IF YOU HAVE DISABILITY OR FAMILY LEAVE FORMS, BRING THEM TO THE               OFFICE FOR PROCESSING.   WHEN TO CALL us [phone]:  1. Poor pain control 2. Reactions / problems with new medications (rash/itching, nausea, etc)  3. Fever over 101.5 F (38.5 C) 4. Inability to urinate 5. Nausea and/or vomiting 6. Worsening swelling or bruising 7. Continued bleeding from incision. 8. Increased pain, redness, or drainage from the incision  The clinic staff is available to answer your questions during regular business hours  (8:30am-5pm). Please dont hesitate to call and ask to speak to one of our nurses for clinical concerns.  If you have a medical emergency, go to the nearest emergency room or call 911.  A surgeon from [facility] is always on call at the [facility], [place]  [address], [place], [place] [address] ?  MAIN: [phone] ? TOLL FREE: [phone] ?  FAX [phone]  [email or web address]

## 2018-03-30 NOTE — Progress Notes (Signed)
Pt returned from PACU instable condition. Post-op sites intact. Pain 8/10 medicated and nausea as ordered. SRP,RN

## 2018-03-30 NOTE — Anesthesia Preprocedure Evaluation (Addendum)
Anesthesia Evaluation  Patient identified by MRN, date of birth, ID band Patient awake    Reviewed: Allergy & Precautions, NPO status , Patient's Chart, lab work & pertinent test results  History of Anesthesia Complications Negative for: history of anesthetic complications  Airway Mallampati: I  TM Distance: >3 FB Neck ROM: Full    Dental  (+) Edentulous Upper, Edentulous Lower   Pulmonary neg pulmonary ROS,    breath sounds clear to auscultation       Cardiovascular hypertension, Pt. on medications (-) angina Rhythm:Regular Rate:Normal     Neuro/Psych  Headaches,    GI/Hepatic Neg liver ROS, GERD  Medicated,N/V with acute chole   Endo/Other  negative endocrine ROS  Renal/GU negative Renal ROS     Musculoskeletal negative musculoskeletal ROS (+)   Abdominal   Peds  Hematology negative hematology ROS (+)   Anesthesia Other Findings   Reproductive/Obstetrics                            Anesthesia Physical Anesthesia Plan  ASA: II  Anesthesia Plan: General   Post-op Pain Management:    Induction: Intravenous  PONV Risk Score and Plan: 3 and Ondansetron, Dexamethasone and Treatment may vary due to age or medical condition  Airway Management Planned: Oral ETT  Additional Equipment:   Intra-op Plan:   Post-operative Plan: Extubation in OR  Informed Consent: I have reviewed the patients History and Physical, chart, labs and discussed the procedure including the risks, benefits and alternatives for the proposed anesthesia with the patient or authorized representative who has indicated his/her understanding and acceptance.   Dental advisory given  Plan Discussed with: CRNA and Surgeon  Anesthesia Plan Comments: (Plan routine monitors, GETA)       Anesthesia Quick Evaluation

## 2018-03-30 NOTE — Consult Note (Addendum)
Jody Cordova Dec 16, 1940  [id number].    Requesting MD: Dr. Shelly Coss Chief Complaint/Reason for Consult: gallstone pancreatitis  HPI:  This is a generally healthy black female who lives in [place] who is here visiting her daughter until this coming Sunday.  She began having epigastric abdominal pain radiating through to her back on Monday along with N/V.  No blood in her emesis.  She has had normal BMs with no blood present there either.  She describes a bloating feeling and took medicine for gas, but it did not help this time.  She has had bloating before that it has helped.  Due to persistent pain, she came to the [facility] where she was found to have pancreatitis on her CT scan as well as gallstones.  Her lipase was 1089.  Her AST and ALT were slightly elevated.  TB was normal until today it slightly rose from 1.2 to 1.3, while her AST/ALT normalized.  She had an [place] as well of her gallbladder that was normal except gallstones.  The patient denies ETOH use, hypertriglyceridemia, medications for DM or cholesterol, etc.  She was admitted and treated for her pancreatitis.  Her lipase is down to 95 today.  We have been asked to see her for surgical intervention.  ROS: ROS: Please see HPI, otherwise all other systems have been reviewed and are negative.  History reviewed. No pertinent family history.  Past Medical History:  Diagnosis Date  . GERD (gastroesophageal reflux disease)   . Hypertension   . Migraine     Past Surgical History:  Procedure Laterality Date  . HERNIA REPAIR     hiatal hernia, umbilical hernia    Social History:  reports that she has never smoked. She has never used smokeless tobacco. She reports that she does not drink alcohol or use drugs.  Allergies: No Known Allergies  Medications Prior to Admission  Medication Sig Dispense Refill  . alendronate (FOSAMAX) 70 MG tablet Take 70 mg by mouth every Monday. Take with a full glass of water on an empty stomach.      Marland Kitchen alum & mag hydroxide-simeth (MAALOX/MYLANTA) 200-200-20 MG/5ML suspension Take 30 mLs by mouth every 6 (six) hours as needed for indigestion or heartburn.    Marland Kitchen aspirin EC 81 MG tablet Take 81 mg by mouth daily.    . cetirizine (ZYRTEC) 10 MG tablet Take 10 mg by mouth daily.    . fluticasone (FLONASE) 50 MCG/ACT nasal spray Place 1 spray into both nostrils daily as needed for allergies or rhinitis.    . hydrOXYzine (ATARAX/VISTARIL) 25 MG tablet Take 25 mg by mouth 2 (two) times daily as needed for anxiety or itching.    Marland Kitchen lisinopril-hydrochlorothiazide (PRINZIDE,ZESTORETIC) 20-25 MG tablet Take 1 tablet by mouth daily.    . meclizine (ANTIVERT) 12.5 MG tablet Take 12.5 mg by mouth 3 (three) times daily as needed for dizziness.    . pantoprazole (PROTONIX) 40 MG tablet Take 40 mg by mouth daily.    . verapamil (CALAN) 40 MG tablet Take 40 mg by mouth daily.       Physical Exam: Blood pressure 109/60, pulse 74, temperature 99.5 F (37.5 C), temperature source Oral, resp. rate 18, height _0  (1.702 m), weight 84.5 kg, SpO2 98 %. General: pleasant, overweight black female who is laying in bed in NAD HEENT: head is normocephalic, atraumatic.  Sclera are noninjected.  PERRL.  Ears and nose without any masses or lesions.  Mouth is  pink and moist Heart: regular, rate, and rhythm, with few PVCs.  Normal s1,s2. No obvious murmurs, gallops, or rubs noted.  Palpable radial and pedal pulses bilaterally Lungs: CTAB, no wheezes, rhonchi, or rales noted.  Respiratory effort nonlabored Abd: soft, NT, ND, +BS, no masses, hernias, or organomegaly.  Scars noted around her umbilicus secondary to her prior hernia repairs MS: all 4 extremities are symmetrical with no cyanosis, clubbing, or edema. Skin: warm and dry with no masses, lesions, or rashes Psych: A&Ox3 with an appropriate affect.   Results for orders placed or performed during the hospital encounter of 03/29/18 (from the past 48 hour(s))  Lipase,  blood     Status: Abnormal   Collection Time: 03/29/18  2:53 AM  Result Value Ref Range   Lipase 1,089 (H) 11 - 51 U/L    Comment: RESULTS CONFIRMED BY MANUAL DILUTION Performed at [facility] [address], [place], [place] [address]   Comprehensive metabolic panel     Status: Abnormal   Collection Time: 03/29/18  2:53 AM  Result Value Ref Range   Sodium 139 135 - 145 mmol/L   Potassium 4.2 3.5 - 5.1 mmol/L    Comment: SLIGHT HEMOLYSIS   Chloride 102 98 - 111 mmol/L   CO2 24 22 - 32 mmol/L   Glucose, Bld 147 (H) 70 - 99 mg/dL   BUN 33 (H) 8 - 23 mg/dL   Creatinine, Ser 0.94 0.44 - 1.00 mg/dL   Calcium 9.3 8.9 - 10.3 mg/dL   Total Protein 7.9 6.5 - 8.1 g/dL   Albumin 4.3 3.5 - 5.0 g/dL   AST 97 (H) 15 - 41 U/L   ALT 53 (H) 0 - 44 U/L   Alkaline Phosphatase 48 38 - 126 U/L   Total Bilirubin 1.2 0.3 - 1.2 mg/dL   GFR calc non Af Amer 56 (L) >60 mL/min   GFR calc Af Amer >60 >60 mL/min    Comment: (NOTE) The eGFR has been calculated using the CKD EPI equation. This calculation has not been validated in all clinical situations. eGFR's persistently <60 mL/min signify possible Chronic Kidney Disease.    Anion gap 13 5 - 15    Comment: Performed at [facility], [place] [address] [place], [place] [address]  CBC     Status: Abnormal   Collection Time: 03/29/18  2:53 AM  Result Value Ref Range   WBC 7.2 4.0 - 10.5 K/uL   RBC 4.05 3.87 - 5.11 MIL/uL   Hemoglobin 11.9 (L) 12.0 - 15.0 g/dL   HCT 35.2 (L) 36.0 - 46.0 %   MCV 86.9 78.0 - 100.0 fL   MCH 29.4 26.0 - 34.0 pg   MCHC 33.8 30.0 - 36.0 g/dL   RDW 13.4 11.5 - 15.5 %   Platelets 267 150 - 400 K/uL    Comment: Performed at [facility], [place] [address] [place], [place] [address]  Lipid panel     Status: Abnormal   Collection Time: 03/29/18  8:03 AM  Result Value Ref Range   Cholesterol 183 0 - 200 mg/dL   Triglycerides 17 <150 mg/dL   HDL 77 >40 mg/dL   Total  CHOL/HDL Ratio 2.4 RATIO   VLDL 3 0 - 40 mg/dL   LDL Cholesterol 103 (H) 0 - 99 mg/dL    Comment:        Total Cholesterol/HDL:CHD Risk Coronary Heart Disease Risk Table  Men   Women  1/2 Average Risk   3.4   3.3  Average Risk       5.0   4.4  2 X Average Risk   9.6   7.1  3 X Average Risk  23.4   11.0        Use the calculated Patient Ratio above and the CHD Risk Table to determine the patient's CHD Risk.        ATP III CLASSIFICATION (LDL):  <100     mg/dL   Optimal  100-129  mg/dL   Near or Above                    Optimal  130-159  mg/dL   Borderline  160-189  mg/dL   High  >190     mg/dL   Very High Performed at [facility] [address], [place], [place] [address]   Urinalysis, Routine w reflex microscopic     Status: Abnormal   Collection Time: 03/29/18  1:24 PM  Result Value Ref Range   Color, Urine YELLOW YELLOW   APPearance CLEAR CLEAR   Specific Gravity, Urine 1.035 (H) 1.005 - 1.030   pH 5.0 5.0 - 8.0   Glucose, UA NEGATIVE NEGATIVE mg/dL   Hgb urine dipstick MODERATE (A) NEGATIVE   Bilirubin Urine NEGATIVE NEGATIVE   Ketones, ur NEGATIVE NEGATIVE mg/dL   Protein, ur NEGATIVE NEGATIVE mg/dL   Nitrite NEGATIVE NEGATIVE   Leukocytes, UA TRACE (A) NEGATIVE   RBC / HPF 0-5 0 - 5 RBC/hpf   WBC, UA 0-5 0 - 5 WBC/hpf   Bacteria, UA NONE SEEN NONE SEEN   Squamous Epithelial / LPF 0-5 0 - 5    Comment: Performed at [facility], [place] [address] [place], [place] [address]  Comprehensive metabolic panel     Status: Abnormal   Collection Time: 03/30/18  4:38 AM  Result Value Ref Range   Sodium 140 135 - 145 mmol/L   Potassium 3.0 (L) 3.5 - 5.1 mmol/L    Comment: DELTA CHECK NOTED REPEATED TO VERIFY    Chloride 105 98 - 111 mmol/L   CO2 26 22 - 32 mmol/L   Glucose, Bld 124 (H) 70 - 99 mg/dL   BUN 18 8 - 23 mg/dL   Creatinine, Ser 0.85 0.44 - 1.00 mg/dL   Calcium 8.3 (L) 8.9 - 10.3 mg/dL   Total  Protein 6.3 (L) 6.5 - 8.1 g/dL   Albumin 3.3 (L) 3.5 - 5.0 g/dL   AST 34 15 - 41 U/L   ALT 40 0 - 44 U/L   Alkaline Phosphatase 38 38 - 126 U/L   Total Bilirubin 1.3 (H) 0.3 - 1.2 mg/dL   GFR calc non Af Amer >60 >60 mL/min   GFR calc Af Amer >60 >60 mL/min    Comment: (NOTE) The eGFR has been calculated using the CKD EPI equation. This calculation has not been validated in all clinical situations. eGFR's persistently <60 mL/min signify possible Chronic Kidney Disease.    Anion gap 9 5 - 15    Comment: Performed at [facility], [place] [address] [place], [place] [address]  CBC     Status: Abnormal   Collection Time: 03/30/18  4:38 AM  Result Value Ref Range   WBC 5.4 4.0 - 10.5 K/uL   RBC 3.45 (L) 3.87 - 5.11 MIL/uL   Hemoglobin 10.0 (L) 12.0 - 15.0 g/dL  HCT 30.0 (L) 36.0 - 46.0 %   MCV 87.0 78.0 - 100.0 fL   MCH 29.0 26.0 - 34.0 pg   MCHC 33.3 30.0 - 36.0 g/dL   RDW 13.8 11.5 - 15.5 %   Platelets 182 150 - 400 K/uL    Comment: Performed at [facility], [place] [address] [place], [place] [address]  Lipase, blood     Status: Abnormal   Collection Time: 03/30/18  4:38 AM  Result Value Ref Range   Lipase 95 (H) 11 - 51 U/L    Comment: Performed at [facility], [place] [address] [place], [place] [address]  Culture, blood (routine x 2)     Status: None (Preliminary result)   Collection Time: 03/30/18  4:38 AM  Result Value Ref Range   Specimen Description      BLOOD RIGHT HAND Performed at [facility] [address] [place], [place] [address]    Special Requests      BOTTLES DRAWN AEROBIC ONLY Blood Culture results may not be optimal due to an inadequate volume of blood received in culture bottles Performed at [facility] [address] [place], [place] [address]    Culture PENDING    Report Status PENDING   Magnesium     Status: Abnormal   Collection Time: 03/30/18  4:38 AM  Result  Value Ref Range   Magnesium 1.6 (L) 1.7 - 2.4 mg/dL    Comment: Performed at [facility], [place] [address] [place], [place] [address]   Ct Abdomen Pelvis W Contrast  Result Date: 03/29/2018 CLINICAL DATA:  Acute abdominal pain.  Nausea and vomiting. EXAM: CT ABDOMEN AND PELVIS WITH CONTRAST TECHNIQUE: Multidetector CT imaging of the abdomen and pelvis was performed using the standard protocol following bolus administration of intravenous contrast. CONTRAST:  150m ISOVUE-300 IOPAMIDOL (ISOVUE-300) INJECTION 61% COMPARISON:  None. FINDINGS: Lower chest: Mild right lower lobe bronchiectasis and basilar scarring. No consolidation. No pleural fluid. Hepatobiliary: Few scattered subcentimeter hepatic hypodensities are too small to accurately characterize. Mild gallbladder distention with layering hyperdensity, small stones versus sludge. No biliary dilatation. Pancreas: Mild peripancreatic fat stranding about the pancreatic head. No evidence pancreatic necrosis, ductal dilatation, or acute peripancreatic fluid collection. Spleen: Normal in size without focal abnormality. Adrenals/Urinary Tract: 3.4 cm low-density left adrenal nodule consistent with adenoma. Right adrenal gland is normal. No hydronephrosis or perinephric edema. Early excretion of IV contrast in the renal collecting systems bilaterally. Urinary bladder is physiologically distended. No bladder wall thickening. Stomach/Bowel: Small hiatal hernia. Detailed bowel evaluation limited in the absence of enteric contrast. Multifocal colonic diverticulosis without diverticulitis. Normal appendix. No small bowel dilatation or inflammation. Vascular/Lymphatic: Minimal aortic atherosclerosis without aneurysm. Splenic vein is patent. No enlarged abdominal or pelvic lymph nodes. Reproductive: Post hysterectomy. Ovaries tentatively identified and quiescent. No suspicious adnexal mass. Other: No free air, free fluid, or intra-abdominal fluid  collection. Musculoskeletal: Scoliotic curvature in degenerative change in the spine. IMPRESSION: 1. Acute pancreatitis without peripancreatic fluid collection or evidence of pancreatic necrosis. 2. Layering stones or sludge in the gallbladder. 3. Multifocal colonic diverticulosis without diverticulitis. 4. Small hiatal hernia.  Incidental left adrenal adenoma. Electronically Signed   By: MKeith RakeM.D.   On: 03/29/2018 05:39   Dg Chest Port 1 View  Result Date: 03/29/2018 CLINICAL DATA:  Elevated liver function studies EXAM: PORTABLE CHEST 1 VIEW COMPARISON:  None. FINDINGS: The lungs are adequately inflated and clear. The heart  and pulmonary vascularity are normal. The mediastinum is normal in width. There is tortuosity of the ascending and descending thoracic aorta. There is multilevel degenerative disc disease of the thoracic spine. IMPRESSION: There is no active cardiopulmonary disease. Electronically Signed   By: David  Martinique M.D.   On: 03/29/2018 09:20   US Abdomen Limited Ruq  Result Date: 03/29/2018 CLINICAL DATA:  Elevated liver function tests. Evaluate for cholelithiasis. EXAM: ULTRASOUND ABDOMEN LIMITED RIGHT UPPER QUADRANT COMPARISON:  Abdominal CT from earlier today FINDINGS: Gallbladder: Numerous layering calculi that are small. Gallbladder is full but there is no wall thickening or focal tenderness. Common bile duct: Diameter: 5 mm Liver: No focal lesion identified. Within normal limits in parenchymal echogenicity. Portal vein is patent on color Doppler imaging with normal direction of blood flow towards the liver. IMPRESSION: 1. Numerous small calculi. 2. No acute finding. Electronically Signed   By: Monte Fantasia M.D.   On: 03/29/2018 10:27      Assessment/Plan Biliary pancreatitis The patient appears to likely have pancreatitis secondary to her gallstones as no obvious source has been noted and she had some slight elevation in her LFTs.  Lap chole +/- IOC has been offered  to the patient and she is currently thinking about this.  She has no pain today and her symptoms have resolved.  I have told her not to eat or drink anything so if she decides to proceed, we can likely do her surgery today.  She already has a CXR, but would need an EKG as a baseline prior to surgery given her age.  Otherwise, she is quite healthy. I have explained the procedure, risks, and aftercare of cholecystectomy.  Risks include but are not limited to bleeding, infection, wound problems, anesthesia, diarrhea, bile leak, injury to common bile duct/liver/intestine.  She seems to understand.  FEN - NPO/IVFs VTE - Lovenox ID - none currently, but likely Rocephin on call to OR if she agrees.  ADDENDUM: Patient agreeable to proceed. She does have a K of 3 and this is currently being replaced with [place], [facility] 03/30/2018, 8:31 AM Pager: [phone]

## 2018-03-30 NOTE — Progress Notes (Signed)
PHARMACY - PHYSICIAN COMMUNICATION CRITICAL VALUE ALERT - BLOOD CULTURE IDENTIFICATION (BCID)  Jody Cordova is an 77 y.o. female who presented to [facility] on 03/29/2018 with a chief complaint of abdominal pain and non-bloody emesis.  Assessment:  Acute pancreatitis s/p lap-chole 9/11  Name of physician (or Provider) Contacted: Tama Gander, NP  Current antibiotics: received ceftriaxone 2g IV pre-op at 11:03  Changes to prescribed antibiotics recommended:  Recommendations accepted by provider - continue Rocephin 2g IV q24h  Results for orders placed or performed during the hospital encounter of 03/29/18  Blood Culture ID Panel (Reflexed) (Collected: 03/30/2018  4:38 AM)  Result Value Ref Range   Enterococcus species NOT DETECTED NOT DETECTED   Listeria monocytogenes NOT DETECTED NOT DETECTED   Staphylococcus species NOT DETECTED NOT DETECTED   Staphylococcus aureus NOT DETECTED NOT DETECTED   Streptococcus species NOT DETECTED NOT DETECTED   Streptococcus agalactiae NOT DETECTED NOT DETECTED   Streptococcus pneumoniae NOT DETECTED NOT DETECTED   Streptococcus pyogenes NOT DETECTED NOT DETECTED   Acinetobacter baumannii NOT DETECTED NOT DETECTED   Enterobacteriaceae species DETECTED (A) NOT DETECTED   Enterobacter cloacae complex NOT DETECTED NOT DETECTED   Escherichia coli DETECTED (A) NOT DETECTED   Klebsiella oxytoca NOT DETECTED NOT DETECTED   Klebsiella pneumoniae NOT DETECTED NOT DETECTED   Proteus species NOT DETECTED NOT DETECTED   Serratia marcescens NOT DETECTED NOT DETECTED   Carbapenem resistance NOT DETECTED NOT DETECTED   Haemophilus influenzae NOT DETECTED NOT DETECTED   Neisseria meningitidis NOT DETECTED NOT DETECTED   Pseudomonas aeruginosa NOT DETECTED NOT DETECTED   Candida albicans NOT DETECTED NOT DETECTED   Candida glabrata NOT DETECTED NOT DETECTED   Candida krusei NOT DETECTED NOT DETECTED   Candida parapsilosis NOT DETECTED NOT DETECTED   Candida  tropicalis NOT DETECTED NOT DETECTED    Loralee Pacas, PharmD, BCPS Pager: [phone] 03/30/2018  8:03 PM

## 2018-03-30 NOTE — Anesthesia Procedure Notes (Signed)
Procedure Name: Intubation Date/Time: 03/30/2018 11:00 AM Performed by: Lind Covert, CRNA Pre-anesthesia Checklist: Patient identified, Emergency Drugs available, Suction available, Patient being monitored and Timeout performed Patient Re-evaluated:Patient Re-evaluated prior to induction Oxygen Delivery Method: Circle system utilized Preoxygenation: Pre-oxygenation with 100% oxygen Induction Type: IV induction Laryngoscope Size: Mac and 4 Grade View: Grade I Tube type: Oral Tube size: 7.0 mm Number of attempts: 1 Airway Equipment and Method: Stylet Placement Confirmation: ETT inserted through vocal cords under direct vision,  positive ETCO2 and breath sounds checked- equal and bilateral Secured at: 22 cm Tube secured with: Tape Dental Injury: Teeth and Oropharynx as per pre-operative assessment

## 2018-03-30 NOTE — Op Note (Signed)
03/29/2018 - 03/30/2018  12:35 PM  PATIENT:  Jody Cordova  77 y.o. female  Patient Care Team: System, Pcp Not In as PCP - General  PRE-OPERATIVE DIAGNOSIS:  billiary pancreatitis  POST-OPERATIVE DIAGNOSIS:  billiary pancreatitis  PROCEDURE: LAPAROSCOPIC CHOLECYSTECTOMY WITH INTRAOPERATIVE CHOLANGIOGRAM    Surgeon(s): Romie Levee, MD  ASSISTANT: none   ANESTHESIA:   local and general  EBL: 3ml Total I/O In: 1200 [I.V.:1200] Out: -   DRAINS: none   SPECIMEN:  Source of Specimen:  gallbladder  DISPOSITION OF SPECIMEN:  PATHOLOGY  COUNTS:  YES  PLAN OF CARE: Pt already admitted  PATIENT DISPOSITION:  PACU - hemodynamically stable.  INDICATION: 77 y.o. F with biliary pancreatitis   The anatomy & physiology of hepatobiliary & pancreatic function was discussed.  The pathophysiology of gallbladder dysfunction was discussed.  Natural history risks without surgery was discussed.   I feel the risks of no intervention will lead to serious problems that outweigh the operative risks; therefore, I recommended cholecystectomy to remove the pathology.  I explained laparoscopic techniques with possible need for an open approach.  Probable cholangiogram to evaluate the bilary tract was explained as well.    Risks such as bleeding, infection, abscess, leak, injury to other organs, need for further treatment, heart attack, death, and other risks were discussed.  I noted a good likelihood this will help address the problem.  Possibility that this will not correct all abdominal symptoms was explained.  Goals of post-operative recovery were discussed as well.    OR FINDINGS: large gallbladder, no signs of cholecystitis.  IOC shows normal anatomy but contrast did not empty into the duodenum   DESCRIPTION:   The patient was identified & brought into the operating room. The patient was positioned supine with arms tucked. SCDs were active during the entire case. The patient underwent general  anesthesia without any difficulty.  The abdomen was prepped and draped in a sterile fashion. A Surgical Timeout was performed and confirmed our plan.  We positioned the patient in reverse [place] & right side up.  I placed a 11mm laparoscopic port through the sub xyphoid region using optiview entry technique.  Entry was clean. There were no bowel adhesions to the anterior abdominal wall supraumbilically.  There was omental adhesions from a previous hernia repair.  I was able to place an 34mm balloon trochar above her mesh.  We induced carbon dioxide insufflation. Camera inspection revealed no injury.   I proceeded to continue with laparoscopic technique. I placed a 5 mm port in mid subcostal region, and another 79mm port in the right flank near the anterior axillary line. I turned attention to the right upper quadrant.  The gallbladder fundus was elevated cephalad. I used cautery and blunt dissection to free the peritoneal coverings between the gallbladder and the liver on the posteriolateral and anteriomedial walls.   I used careful blunt and cautery dissection with a maryland dissector to help get a good critical view of the cystic artery and cystic duct. I did further dissection to free a few centimeters of the  gallbladder off the liver bed to get a good critical view of the infundibulum and cystic duct. I mobilized the cystic artery.  I skeletonized the cystic duct.  After getting a good 360 view, I decided to perform a cholangiogram.  I placed a clip on the infundibulum.   I did a partial cystic duct-otomy and ensured patency. I placed a 5 F cholangiocatheter through a puncture site at the  right subcostal ridge of the abdominal wall and directed it into the cystic duct.  This was secured with a clip. We ran a cholangiogram with dilute radio-opaque contrast and continuous fluoroscopy.  Contrast flowed from a side branch consistent with cystic duct cannulization. Contrast flowed up the common hepatic  duct into the right and left intrahepatic chains out to secondary radicals. Contrast flowed down the common bile duct but did not cross ampulla into the duodenum.  I could not see an obvious filling defect. I removed the cholangiocatheter.  I placed clips on the cystic duct x3.  I completed cystic duct transection.   I placed clips on the cystic artery x3 with 2 proximally.  I ligated the cystic artery using scissors. I freed the gallbladder from its remaining attachments to the liver. I ensured hemostasis on the gallbladder fossa of the liver and elsewhere. I inspected the rest of the abdomen & detected no injury nor bleeding elsewhere.  I irrigated the RUQ with normal saline.  I placed surgical "snow" in the gallbladder fossa for added hemostasis.  I removed the gallbladder through the umbilical port site.  I closed the umbilical fascia using 0 Vicryl stitches x2.   I closed the skin using 4-0 vicryl stitch.  Sterile dressings were applied. The patient was extubated & arrived in the PACU in stable condition.  I had discussed postoperative care with the patient in the holding area.  I will discuss operative findings and postoperative goals / instructions with the patient's family.  Instructions are written in the chart as well.

## 2018-03-30 NOTE — Transfer of Care (Signed)
Immediate Anesthesia Transfer of Care Note  Patient: Jody Cordova  Procedure(s) Performed: LAPAROSCOPIC CHOLECYSTECTOMY WITH INTRAOPERATIVE CHOLANGIOGRAM (N/A Abdomen)  Patient Location: PACU  Anesthesia Type:General  Level of Consciousness: sedated  Airway & Oxygen Therapy: Patient Spontanous Breathing and Patient connected to face mask oxygen  Post-op Assessment: Report given to RN and Post -op Vital signs reviewed and stable  Post vital signs: Reviewed and stable  Last Vitals:  Vitals Value Taken Time  BP 125/70 03/30/2018 12:43 PM  Temp    Pulse 86 03/30/2018 12:44 PM  Resp 21 03/30/2018 12:44 PM  SpO2 96 % 03/30/2018 12:44 PM  Vitals shown include unvalidated device data.  Last Pain:  Vitals:   03/30/18 0900  TempSrc:   PainSc: 0-No pain      Patients Stated Pain Goal: 0 (03/29/18 1103)  Complications: No apparent anesthesia complications

## 2018-03-30 NOTE — Progress Notes (Signed)
Received report from Vernon, RN at pt bedside at this time. Pt is alert and has no s/s of distress. VS and orders assessed and will continue to monitor and tx pt according to MD orders.

## 2018-03-30 NOTE — Progress Notes (Addendum)
PROGRESS NOTE    Jody Cordova  ZOX:[id number] DOB: 1941/07/20 DOA: 03/29/2018 PCP: System, Pcp Not In   Brief Narrative: Patient is a 77 y.o. female with medical history significant for hypertension, migraine, GERD presents to the ED complaining of abdominal pain and non-bloody vomiting for 1 day.  Abdominal pain was located in the epigastric region, sharp, radiating to the back with associated nausea and nonbloody vomiting.    Patient is from [place] visiting her daughter here.  Patient denied any alcohol use.   CT abdomen done on presentation showed acute pancreatitis without peripancreatic fluid collection or necrosis.  Ultrasound of the gallbladder showed numerous gallstones.  Surgery consulted today for biliary pancreatitis. Underwent laparascopic cholecystectomy.  Assessment & Plan:   Principal Problem:   Pancreatitis Active Problems:   GERD (gastroesophageal reflux disease)   Essential hypertension   Migraine   Acute biliary pancreatitis Lipase elevated at 1089, AST 97, ALT 53 on admission. Denies any alcohol use, Normal lipid panel  CT abdomen showed acute pancreatitis without peripancreatic fluid collection or evidence of pancreatic necrosis.  Mild gallbladder distention with layering hyperdensity, small stones versus sludge.  No biliary dilatation. RUQ ultrasound showed cholelithiasis Patient's abdominal pain has resolved this morning.  Lipase has trended down. Surgery consulted for biliary pancreatitis.  Undergoing laparoscopic cholecystectomy today.  GERD Continue IV Pepcid  Hypertension Stable Hold home lisinopril, hydrochlorothiazide   Migraine Hold home verapamil for now as patient is n.p.o.  Hypokalemia/hypomagnesemia: Being supplemented.Will check levels tomorrow.   DVT prophylaxis: Lovenox Code Status: Full Family Communication: Family member present at the bed side Disposition Plan: Likely home tomorrow after surgical clearance   Consultants:  Gen surgery  Procedures: Laparoscopic cholecystectomy  Antimicrobials: None  Subjective: Patient seen and examined at bedside this morning.  Remains comfortable.  Denies any abdominal pain, nausea or vomiting.  Plan for surgery today.  Objective: Vitals:   03/30/18 1350 03/30/18 1355 03/30/18 1400 03/30/18 1415  BP:   133/73 129/67  Pulse: 86 88 85 87  Resp: 18 20 14 16   Temp:   99.2 F (37.3 C) 99.9 F (37.7 C)  TempSrc:      SpO2: 99% 98% 97% 96%  Weight:      Height:        Intake/Output Summary (Last 24 hours) at 03/30/2018 1425 Last data filed at 03/30/2018 1234 Gross per 24 hour  Intake 2441.95 ml  Output -  Net 2441.95 ml   Filed Weights   03/29/18 0733  Weight: 84.5 kg    Examination:  General exam: Appears calm and comfortable ,Not in distress,average built HEENT:PERRL,Oral mucosa moist, Ear/Nose normal on gross exam Respiratory system: Bilateral equal air entry, normal vesicular breath sounds, no wheezes or crackles  Cardiovascular system: S1 & S2 heard, RRR. No JVD, murmurs, rubs, gallops or clicks. No pedal edema. Gastrointestinal system: Abdomen is nondistended, soft and nontender. No organomegaly or masses felt. Normal bowel sounds heard. Central nervous system: Alert and oriented. No focal neurological deficits. Extremities: No edema, no clubbing ,no cyanosis, distal peripheral pulses palpable. Skin: No rashes, lesions or ulcers,no icterus ,no pallor MSK: Normal muscle bulk,tone ,power Psychiatry: Judgement and insight appear normal. Mood & affect appropriate.     Data Reviewed: I have personally reviewed following labs and imaging studies  CBC: Recent Labs  Lab 03/29/18 0253 03/30/18 0438  WBC 7.2 5.4  HGB 11.9* 10.0*  HCT 35.2* 30.0*  MCV 86.9 87.0  PLT 267 182   Basic Metabolic Panel: Recent  Labs  Lab 03/29/18 0253 03/30/18 0438  NA 139 140  K 4.2 3.0*  CL 102 105  CO2 24 26  GLUCOSE 147* 124*  BUN 33* 18  CREATININE 0.94 0.85   CALCIUM 9.3 8.3*  MG  --  1.6*   GFR: Estimated Creatinine Clearance: 60 mL/min (by C-G formula based on SCr of 0.85 mg/dL). Liver Function Tests: Recent Labs  Lab 03/29/18 0253 03/30/18 0438  AST 97* 34  ALT 53* 40  ALKPHOS 48 38  BILITOT 1.2 1.3*  PROT 7.9 6.3*  ALBUMIN 4.3 3.3*   Recent Labs  Lab 03/29/18 0253 03/30/18 0438  LIPASE 1,089* 95*   No results for input(s): AMMONIA in the last 168 hours. Coagulation Profile: No results for input(s): INR, PROTIME in the last 168 hours. Cardiac Enzymes: No results for input(s): CKTOTAL, CKMB, CKMBINDEX, TROPONINI in the last 168 hours. BNP (last 3 results) No results for input(s): PROBNP in the last 8760 hours. HbA1C: No results for input(s): HGBA1C in the last 72 hours. CBG: No results for input(s): GLUCAP in the last 168 hours. Lipid Profile: Recent Labs    03/29/18 0803  CHOL 183  HDL 77  LDLCALC 103*  TRIG 17  CHOLHDL 2.4   Thyroid Function Tests: No results for input(s): TSH, T4TOTAL, FREET4, T3FREE, THYROIDAB in the last 72 hours. Anemia Panel: No results for input(s): VITAMINB12, FOLATE, FERRITIN, TIBC, IRON, RETICCTPCT in the last 72 hours. Sepsis Labs: No results for input(s): PROCALCITON, LATICACIDVEN in the last 168 hours.  Recent Results (from the past 240 hour(s))  Culture, blood (routine x 2)     Status: None (Preliminary result)   Collection Time: 03/30/18  4:38 AM  Result Value Ref Range Status   Specimen Description   Final    BLOOD RIGHT HAND Performed at [facility], [address], [place], [place] [address]    Special Requests   Final    BOTTLES DRAWN AEROBIC ONLY Blood Culture results may not be optimal due to an inadequate volume of blood received in culture bottles Performed at [facility], [address] [place], [place] [address]    Culture PENDING  Incomplete   Report Status PENDING  Incomplete  MRSA PCR Screening     Status: None   Collection Time:  03/30/18  9:06 AM  Result Value Ref Range Status   MRSA by PCR NEGATIVE NEGATIVE Final    Comment:        The GeneXpert MRSA Assay (FDA approved for NASAL specimens only), is one component of a comprehensive MRSA colonization surveillance program. It is not intended to diagnose MRSA infection nor to guide or monitor treatment for MRSA infections. Performed at [facility], [address], [place], [place] [address]          Radiology Studies: Dg Cholangiogram Operative  Result Date: 03/30/2018 CLINICAL DATA:  77 year old female undergoing laparoscopic cholecystectomy for cholelithiasis. EXAM: INTRAOPERATIVE CHOLANGIOGRAM TECHNIQUE: Cholangiographic images from the C-arm fluoroscopic device were submitted for interpretation post-operatively. Please see the procedural report for the amount of contrast and the fluoroscopy time utilized. COMPARISON:  CT scan of the abdomen and pelvis and abdominal ultrasound 03/29/2018 FINDINGS: Several intraoperative saved images were obtained during intraoperative cholangiogram at the time of laparoscopic cholecystectomy. The images demonstrate cannulation of the cystic duct remanent and opacification of the biliary tree. No significant biliary ductal dilatation, stenosis, stricture or convincing evidence of choledocholithiasis. However, there is no definite passage of contrast  material through the ampulla. IMPRESSION: 1. No definite passage of contrast material through the ampulla and into the duodenum. Ampullary dysfunction, stenosis or stricture is difficult to exclude entirely. 2. Otherwise, negative intraoperative cholangiogram. No significant biliary ductal dilatation or evidence of choledocholithiasis. Electronically Signed   By: Malachy Moan M.D.   On: 03/30/2018 12:15   Ct Abdomen Pelvis W Contrast  Result Date: 03/29/2018 CLINICAL DATA:  Acute abdominal pain.  Nausea and vomiting. EXAM: CT ABDOMEN AND PELVIS WITH CONTRAST  TECHNIQUE: Multidetector CT imaging of the abdomen and pelvis was performed using the standard protocol following bolus administration of intravenous contrast. CONTRAST:  ISOVUE-300 IOPAMIDOL (ISOVUE-300) INJECTION 61% COMPARISON:  None. FINDINGS: Lower chest: Mild right lower lobe bronchiectasis and basilar scarring. No consolidation. No pleural fluid. Hepatobiliary: Few scattered subcentimeter hepatic hypodensities are too small to accurately characterize. Mild gallbladder distention with layering hyperdensity, small stones versus sludge. No biliary dilatation. Pancreas: Mild peripancreatic fat stranding about the pancreatic head. No evidence pancreatic necrosis, ductal dilatation, or acute peripancreatic fluid collection. Spleen: Normal in size without focal abnormality. Adrenals/Urinary Tract: 3.4 cm low-density left adrenal nodule consistent with adenoma. Right adrenal gland is normal. No hydronephrosis or perinephric edema. Early excretion of IV contrast in the renal collecting systems bilaterally. Urinary bladder is physiologically distended. No bladder wall thickening. Stomach/Bowel: Small hiatal hernia. Detailed bowel evaluation limited in the absence of enteric contrast. Multifocal colonic diverticulosis without diverticulitis. Normal appendix. No small bowel dilatation or inflammation. Vascular/Lymphatic: Minimal aortic atherosclerosis without aneurysm. Splenic vein is patent. No enlarged abdominal or pelvic lymph nodes. Reproductive: Post hysterectomy. Ovaries tentatively identified and quiescent. No suspicious adnexal mass. Other: No free air, free fluid, or intra-abdominal fluid collection. Musculoskeletal: Scoliotic curvature in degenerative change in the spine. IMPRESSION: 1. Acute pancreatitis without peripancreatic fluid collection or evidence of pancreatic necrosis. 2. Layering stones or sludge in the gallbladder. 3. Multifocal colonic diverticulosis without diverticulitis. 4. Small hiatal  hernia.  Incidental left adrenal adenoma. Electronically Signed   By: Narda Rutherford M.D.   On: 03/29/2018 05:39   Dg Chest Port 1 View  Result Date: 03/29/2018 CLINICAL DATA:  Elevated liver function studies EXAM: PORTABLE CHEST 1 VIEW COMPARISON:  None. FINDINGS: The lungs are adequately inflated and clear. The heart and pulmonary vascularity are normal. The mediastinum is normal in width. There is tortuosity of the ascending and descending thoracic aorta. There is multilevel degenerative disc disease of the thoracic spine. IMPRESSION: There is no active cardiopulmonary disease. Electronically Signed   By: David  [place] M.D.   On: 03/29/2018 09:20   US Abdomen Limited Ruq  Result Date: 03/29/2018 CLINICAL DATA:  Elevated liver function tests. Evaluate for cholelithiasis. EXAM: ULTRASOUND ABDOMEN LIMITED RIGHT UPPER QUADRANT COMPARISON:  Abdominal CT from earlier today FINDINGS: Gallbladder: Numerous layering calculi that are small. Gallbladder is full but there is no wall thickening or focal tenderness. Common bile duct: Diameter: 5 mm Liver: No focal lesion identified. Within normal limits in parenchymal echogenicity. Portal vein is patent on color Doppler imaging with normal direction of blood flow towards the liver. IMPRESSION: 1. Numerous small calculi. 2. No acute finding. Electronically Signed   By: Marnee Spring M.D.   On: 03/29/2018 10:27        Scheduled Meds: . Chlorhexidine Gluconate Cloth  6 each Topical Q0600  . [START ON 03/31/2018] enoxaparin (LOVENOX) injection  40 mg Subcutaneous Q24H  . fentaNYL      . sodium chloride flush  3 mL Intravenous Q12H  Continuous Infusions: . sodium chloride 100 mL/hr at 03/30/18 0513  . sodium chloride    . famotidine (PEPCID) IV 20 mg (03/29/18 2252)     LOS: 1 day    Time spent: 25 mins.More than 50% of that time was spent in counseling and/or coordination of care.      Burnadette Pop, MD [facility] Pager  [phone]  If 7PM-7AM, please contact night-coverage [email or web address] Password [email or web address] 03/30/2018, 2:25 PM

## 2018-03-31 ENCOUNTER — Encounter (HOSPITAL_COMMUNITY): Payer: Self-pay | Admitting: General Surgery

## 2018-03-31 DIAGNOSIS — R7881 Bacteremia: Secondary | ICD-10-CM

## 2018-03-31 LAB — COMPREHENSIVE METABOLIC PANEL
ALT: 103 U/L — AB (ref 0–44)
AST: 88 U/L — AB (ref 15–41)
Albumin: 2.9 g/dL — ABNORMAL LOW (ref 3.5–5.0)
Alkaline Phosphatase: 58 U/L (ref 38–126)
Anion gap: 11 (ref 5–15)
BUN: 14 mg/dL (ref 8–23)
CO2: 24 mmol/L (ref 22–32)
CREATININE: 0.84 mg/dL (ref 0.44–1.00)
Calcium: 7.8 mg/dL — ABNORMAL LOW (ref 8.9–10.3)
Chloride: 104 mmol/L (ref 98–111)
Glucose, Bld: 160 mg/dL — ABNORMAL HIGH (ref 70–99)
POTASSIUM: 3.5 mmol/L (ref 3.5–5.1)
SODIUM: 139 mmol/L (ref 135–145)
Total Bilirubin: 0.8 mg/dL (ref 0.3–1.2)
Total Protein: 6 g/dL — ABNORMAL LOW (ref 6.5–8.1)

## 2018-03-31 LAB — MAGNESIUM: MAGNESIUM: 2.1 mg/dL (ref 1.7–2.4)

## 2018-03-31 MED ORDER — MECLIZINE HCL 25 MG PO TABS: 12.5000 mg | ORAL_TABLET | Freq: Three times a day (TID) | ORAL | Status: DC | PRN

## 2018-03-31 MED ORDER — ALUM & MAG HYDROXIDE-SIMETH 200-200-20 MG/5ML PO SUSP
30.0000 mL | ORAL | Status: DC | PRN
  Administered 2018-03-31: 30 mL via ORAL
  Filled 2018-03-31: qty 30

## 2018-03-31 MED ORDER — POTASSIUM CHLORIDE CRYS ER 20 MEQ PO TBCR
40.0000 meq | EXTENDED_RELEASE_TABLET | Freq: Once | ORAL | Status: AC
  Administered 2018-03-31: 40 meq via ORAL
  Filled 2018-03-31: qty 2

## 2018-03-31 MED ORDER — ACETAMINOPHEN 500 MG PO TABS
1000.0000 mg | ORAL_TABLET | Freq: Four times a day (QID) | ORAL | Status: DC
  Administered 2018-03-31 – 2018-04-01 (×5): 1000 mg via ORAL
  Filled 2018-03-31 (×5): qty 2

## 2018-03-31 MED ORDER — VERAPAMIL HCL 40 MG PO TABS
40.0000 mg | ORAL_TABLET | Freq: Every day | ORAL | Status: DC
  Administered 2018-03-31 – 2018-04-01 (×2): 40 mg via ORAL
  Filled 2018-03-31 (×2): qty 1

## 2018-03-31 MED ORDER — OXYCODONE HCL 5 MG PO TABS: 5.0000 mg | ORAL_TABLET | ORAL | Status: DC | PRN

## 2018-03-31 NOTE — Anesthesia Postprocedure Evaluation (Signed)
Anesthesia Post Note  Patient: Lennox PippinsMary Eliot  Procedure(s) Performed: LAPAROSCOPIC CHOLECYSTECTOMY WITH INTRAOPERATIVE CHOLANGIOGRAM (N/A Abdomen)     Patient location during evaluation: PACU Anesthesia Type: General Level of consciousness: awake and alert Pain management: pain level controlled Vital Signs Assessment: post-procedure vital signs reviewed and stable Respiratory status: spontaneous breathing, nonlabored ventilation, respiratory function stable and patient connected to nasal cannula oxygen Cardiovascular status: blood pressure returned to baseline and stable Postop Assessment: no apparent nausea or vomiting Anesthetic complications: no    Last Vitals:  Vitals:   03/30/18 2255 03/31/18 0432  BP:  119/75  Pulse:  75  Resp:  15  Temp: 37.7 C 37.4 C  SpO2:  95%    Last Pain:  Vitals:   03/31/18 0432  TempSrc: Oral  PainSc:                  Kennieth RadFitzgerald, Abhimanyu Cruces E

## 2018-03-31 NOTE — Progress Notes (Signed)
PROGRESS NOTE    Jody PippinsMary Cordova  ZOX:[id number] DOB: 04/23/1941 DOA: 03/29/2018 PCP: System, Pcp Not In   Brief Narrative: Patient is a 77 y.o. female with medical history significant for hypertension, migraine, GERD presents to the ED complaining of abdominal pain and non-bloody vomiting for 1 day.  Abdominal pain was located in the epigastric region, sharp, radiating to the back with associated nausea and nonbloody vomiting.    Patient is [place] her daughter here.  Patient denied any alcohol use.   CT abdomen done on presentation showed acute pancreatitis without peripancreatic fluid collection or necrosis.  Ultrasound of the gallbladder showed numerous gallstones.  Surgery consulted today for biliary pancreatitis. Underwent laparascopic cholecystectomy.  Her blood cultures also grew E. coli and she is currently on antibiotics  Assessment & Plan:   Principal Problem:   Pancreatitis Active Problems:   GERD (gastroesophageal reflux disease)   Essential hypertension   Migraine   Bacteremia   Acute biliary pancreatitis Lipase elevated at 1089, AST 97, ALT 53 on admission. Denies any alcohol use, Normal lipid panel  CT abdomen showed acute pancreatitis without peripancreatic fluid collection or evidence of pancreatic necrosis.  Mild gallbladder distention with layering hyperdensity, small stones versus sludge.  No biliary dilatation. RUQ ultrasound showed cholelithiasis Patient's abdominal pain has rimproved.  Lipase has trended down. Surgery consulted for biliary pancreatitis.  Underwent laparoscopic cholecystectomy on 03/30/18. Plan is to advance her diet today.  E. coli bacteremia:   She did not have any signs of cholecystitis.  Will presume the source is biliary.  UA not impressive.  On ceftriaxone.  We will change the antibiotics to oral tomorrow.  She is currently hemodynamically stable, no signs of sepsis.  GERD Continue IV Pepcid  Hypertension Stable Hold  home lisinopril, hydrochlorothiazide   Migraine Continue verapamil   Hypokalemia/hypomagnesemia: Supplemented and corrected  DVT prophylaxis: Lovenox Code Status: Full Family Communication: Family members present at the bed side Disposition Plan: Likely home tomorrow after surgical clearance   Consultants: Gen surgery  Procedures: Laparoscopic cholecystectomy  Antimicrobials: None  Subjective: Patient seen and examined at bedside this morning.  Remains comfortable.  Complains of some abdominal pain on the right upper quadrant.  No nausea or vomiting.  Hemodynamically stable.  Surgery planning to advance the diet today.  Objective: Vitals:   03/30/18 1900 03/30/18 2110 03/30/18 2255 03/31/18 0432  BP: (!) 141/69   119/75  Pulse: 85   75  Resp: 20   15  Temp: (!) 100.4 F (38 C) (!) 100.6 F (38.1 C) 99.9 F (37.7 C) 99.3 F (37.4 C)  TempSrc: Oral Oral Oral Oral  SpO2: 94%   95%  Weight:      Height:        Intake/Output Summary (Last 24 hours) at 03/31/2018 1055 Last data filed at 03/31/2018 1027 Gross per 24 hour  Intake 2840 ml  Output 350 ml  Net 2490 ml   Filed Weights   03/29/18 0733  Weight: 84.5 kg    Examination:  General exam: Appears calm and comfortable ,Not in distress,average built HEENT:PERRL,Oral mucosa moist, Ear/Nose normal on gross exam Respiratory system: Bilateral equal air entry, normal vesicular breath sounds, no wheezes or crackles  Cardiovascular system: S1 & S2 heard, RRR. No JVD, murmurs, rubs, gallops or clicks. Gastrointestinal system: Abdomen is nondistended, soft and nontender. No organomegaly or masses felt. Normal bowel sounds heard.  Clean surgical scars Central nervous system: Alert and oriented. No focal neurological deficits.  Extremities: No edema, no clubbing ,no cyanosis, distal peripheral pulses palpable. Skin: No rashes, lesions or ulcers,no icterus ,no pallor MSK: Normal muscle bulk,tone ,power Psychiatry: Judgement  and insight appear normal. Mood & affect appropriate.     Data Reviewed: I have personally reviewed following labs and imaging studies  CBC: Recent Labs  Lab 03/29/18 0253 03/30/18 0438  WBC 7.2 5.4  HGB 11.9* 10.0*  HCT 35.2* 30.0*  MCV 86.9 87.0  PLT 267 182   Basic Metabolic Panel: Recent Labs  Lab 03/29/18 0253 03/30/18 0438 03/31/18 0358  NA 139 140 139  K 4.2 3.0* 3.5  CL 102 105 104  CO2 24 26 24   GLUCOSE 147* 124* 160*  BUN 33* 18 14  CREATININE 0.94 0.85 0.84  CALCIUM 9.3 8.3* 7.8*  MG  --  1.6* 2.1   GFR: Estimated Creatinine Clearance: 60.7 mL/min (by C-G formula based on SCr of 0.84 mg/dL). Liver Function Tests: Recent Labs  Lab 03/29/18 0253 03/30/18 0438 03/31/18 0358  AST 97* 34 88*  ALT 53* 40 103*  ALKPHOS 48 38 58  BILITOT 1.2 1.3* 0.8  PROT 7.9 6.3* 6.0*  ALBUMIN 4.3 3.3* 2.9*   Recent Labs  Lab 03/29/18 0253 03/30/18 0438  LIPASE 1,089* 95*   No results for input(s): AMMONIA in the last 168 hours. Coagulation Profile: No results for input(s): INR, PROTIME in the last 168 hours. Cardiac Enzymes: No results for input(s): CKTOTAL, CKMB, CKMBINDEX, TROPONINI in the last 168 hours. BNP (last 3 results) No results for input(s): PROBNP in the last 8760 hours. HbA1C: No results for input(s): HGBA1C in the last 72 hours. CBG: No results for input(s): GLUCAP in the last 168 hours. Lipid Profile: Recent Labs    03/29/18 0803  CHOL 183  HDL 77  LDLCALC 103*  TRIG 17  CHOLHDL 2.4   Thyroid Function Tests: No results for input(s): TSH, T4TOTAL, FREET4, T3FREE, THYROIDAB in the last 72 hours. Anemia Panel: No results for input(s): VITAMINB12, FOLATE, FERRITIN, TIBC, IRON, RETICCTPCT in the last 72 hours. Sepsis Labs: No results for input(s): PROCALCITON, LATICACIDVEN in the last 168 hours.  Recent Results (from the past 240 hour(s))  Culture, blood (routine x 2)     Status: Abnormal (Preliminary result)   Collection Time:  03/30/18  4:38 AM  Result Value Ref Range Status   Specimen Description   Final    BLOOD LEFT ANTECUBITAL Performed at [facility], [address] [place], [place] [address]    Special Requests   Final    BOTTLES DRAWN AEROBIC AND ANAEROBIC Blood Culture adequate volume Performed at [facility], [address] [place], [place] [address]    Culture  Setup Time   Final    IN BOTH AEROBIC AND ANAEROBIC BOTTLES GRAM NEGATIVE RODS CRITICAL RESULT CALLED TO, READ BACK BY AND VERIFIED WITH: T GREEN PHARMD 03/30/18 1954 JDW Performed at [facility], [address], [place], [place] [address]    Culture ESCHERICHIA COLI (A)  Final   Report Status PENDING  Incomplete  Culture, blood (routine x 2)     Status: None (Preliminary result)   Collection Time: 03/30/18  4:38 AM  Result Value Ref Range Status   Specimen Description   Final    BLOOD RIGHT HAND Performed at [facility], [address] [place], [place] [address]    Special Requests   Final    BOTTLES DRAWN AEROBIC ONLY Blood Culture  results may not be optimal due to an inadequate volume of blood received in culture bottles   Culture   Final    NO GROWTH < 24 HOURS Performed at [facility], [address], [place], [place] [address]    Report Status PENDING  Incomplete  Blood Culture ID Panel (Reflexed)     Status: Abnormal   Collection Time: 03/30/18  4:38 AM  Result Value Ref Range Status   Enterococcus species NOT DETECTED NOT DETECTED Final   Listeria monocytogenes NOT DETECTED NOT DETECTED Final   Staphylococcus species NOT DETECTED NOT DETECTED Final   Staphylococcus aureus NOT DETECTED NOT DETECTED Final   Streptococcus species NOT DETECTED NOT DETECTED Final   Streptococcus agalactiae NOT DETECTED NOT DETECTED Final   Streptococcus pneumoniae NOT DETECTED NOT DETECTED Final   Streptococcus pyogenes NOT DETECTED NOT DETECTED Final   Acinetobacter  baumannii NOT DETECTED NOT DETECTED Final   Enterobacteriaceae species DETECTED (A) NOT DETECTED Final    Comment: Enterobacteriaceae represent a large family of gram-negative bacteria, not a single organism. CRITICAL RESULT CALLED TO, READ BACK BY AND VERIFIED WITH: T GREEN PHARMD 03/30/18 1954 JDW    Enterobacter cloacae complex NOT DETECTED NOT DETECTED Final   Escherichia coli DETECTED (A) NOT DETECTED Final    Comment: CRITICAL RESULT CALLED TO, READ BACK BY AND VERIFIED WITH: T GREEN PHARMD 03/30/18 1954 JDW    Klebsiella oxytoca NOT DETECTED NOT DETECTED Final   Klebsiella pneumoniae NOT DETECTED NOT DETECTED Final   Proteus species NOT DETECTED NOT DETECTED Final   Serratia marcescens NOT DETECTED NOT DETECTED Final   Carbapenem resistance NOT DETECTED NOT DETECTED Final   Haemophilus influenzae NOT DETECTED NOT DETECTED Final   Neisseria meningitidis NOT DETECTED NOT DETECTED Final   Pseudomonas aeruginosa NOT DETECTED NOT DETECTED Final   Candida albicans NOT DETECTED NOT DETECTED Final   Candida glabrata NOT DETECTED NOT DETECTED Final   Candida krusei NOT DETECTED NOT DETECTED Final   Candida parapsilosis NOT DETECTED NOT DETECTED Final   Candida tropicalis NOT DETECTED NOT DETECTED Final    Comment: Performed at [facility], [address], [place], [place] [address]  MRSA PCR Screening     Status: None   Collection Time: 03/30/18  9:06 AM  Result Value Ref Range Status   MRSA by PCR NEGATIVE NEGATIVE Final    Comment:        The GeneXpert MRSA Assay (FDA approved for NASAL specimens only), is one component of a comprehensive MRSA colonization surveillance program. It is not intended to diagnose MRSA infection nor to guide or monitor treatment for MRSA infections. Performed at [facility], [address], [place], [place] [address]          Radiology Studies: Dg Cholangiogram Operative  Result Date: 03/30/2018 CLINICAL DATA:   77 year old female undergoing laparoscopic cholecystectomy for cholelithiasis. EXAM: INTRAOPERATIVE CHOLANGIOGRAM TECHNIQUE: Cholangiographic images from the C-arm fluoroscopic device were submitted for interpretation post-operatively. Please see the procedural report for the amount of contrast and the fluoroscopy time utilized. COMPARISON:  CT scan of the abdomen and pelvis and abdominal ultrasound 03/29/2018 FINDINGS: Several intraoperative saved images were obtained during intraoperative cholangiogram at the time of laparoscopic cholecystectomy. The images demonstrate cannulation of the cystic duct remanent and opacification of the biliary tree. No significant biliary ductal dilatation, stenosis, stricture or convincing evidence of choledocholithiasis. However, there is no definite passage of contrast material through the ampulla. IMPRESSION: 1. No definite  passage of contrast material through the ampulla and into the duodenum. Ampullary dysfunction, stenosis or stricture is difficult to exclude entirely. 2. Otherwise, negative intraoperative cholangiogram. No significant biliary ductal dilatation or evidence of choledocholithiasis. Electronically Signed   By: Malachy Moan M.D.   On: 03/30/2018 12:15        Scheduled Meds: . acetaminophen  1,000 mg Oral Q6H  . enoxaparin (LOVENOX) injection  40 mg Subcutaneous Q24H  . sodium chloride flush  3 mL Intravenous Q12H  . verapamil  40 mg Oral Daily   Continuous Infusions: . sodium chloride    . sodium chloride 50 mL/hr at 03/30/18 1435  . cefTRIAXone (ROCEPHIN)  IV    . famotidine (PEPCID) IV Stopped (03/30/18 2148)     LOS: 2 days    Time spent: 25 mins.More than 50% of that time was spent in counseling and/or coordination of care.      Burnadette Pop, MD [facility] Pager [phone]  If 7PM-7AM, please contact night-coverage [email or web address] Password [facility] 03/31/2018, 10:55 AM

## 2018-03-31 NOTE — Progress Notes (Signed)
Patient ID: Jody Cordova, female   DOB: [birth year], 77 y.o.   MRN: [id number]    1 Day Post-Op  Subjective: Pt with a little nausea.  Tolerating clear liquids.  C/o some gas as well as a headache.  Voiding well.  Just mobilized in the halls and is up in her chair.  Objective: Vital signs in last 24 hours: Temp:  [98.2 F (36.8 C)-100.6 F (38.1 C)] 99.3 F (37.4 C) (09/12 0432) Pulse Rate:  [75-107] 75 (09/12 0432) Resp:  [14-24] 15 (09/12 0432) BP: (119-149)/(66-87) 119/75 (09/12 0432) SpO2:  [90 %-100 %] 95 % (09/12 0432) Last BM Date: 03/30/18  Intake/Output from previous day: 09/11 0701 - 09/12 0700 In: 2720 [P.O.:355; I.V.:1805.5; IV Piggyback:559.5] Out: 350 [Urine:350] Intake/Output this shift: No intake/output data recorded.  PE: Abd: soft, essentially NT, ND, incisions c/d/i with some ecchymosis around periumbilical incision, but no evidence of infection.  Lab Results:  Recent Labs    03/29/18 0253 03/30/18 0438  WBC 7.2 5.4  HGB 11.9* 10.0*  HCT 35.2* 30.0*  PLT 267 182   BMET Recent Labs    03/30/18 0438 03/31/18 0358  NA 140 139  K 3.0* 3.5  CL 105 104  CO2 26 24  GLUCOSE 124* 160*  BUN 18 14  CREATININE 0.85 0.84  CALCIUM 8.3* 7.8*   PT/INR No results for input(s): LABPROT, INR in the last 72 hours. CMP     Component Value Date/Time   NA 139 03/31/2018 0358   K 3.5 03/31/2018 0358   CL 104 03/31/2018 0358   CO2 24 03/31/2018 0358   GLUCOSE 160 (H) 03/31/2018 0358   BUN 14 03/31/2018 0358   CREATININE 0.84 03/31/2018 0358   CALCIUM 7.8 (L) 03/31/2018 0358   PROT 6.0 (L) 03/31/2018 0358   ALBUMIN 2.9 (L) 03/31/2018 0358   AST 88 (H) 03/31/2018 0358   ALT 103 (H) 03/31/2018 0358   ALKPHOS 58 03/31/2018 0358   BILITOT 0.8 03/31/2018 0358   GFRNONAA >60 03/31/2018 0358   GFRAA >60 03/31/2018 0358   Lipase     Component Value Date/Time   LIPASE 95 (H) 03/30/2018 0438       Studies/Results: Dg Cholangiogram Operative  Result  Date: 03/30/2018 CLINICAL DATA:  77 year old female undergoing laparoscopic cholecystectomy for cholelithiasis. EXAM: INTRAOPERATIVE CHOLANGIOGRAM TECHNIQUE: Cholangiographic images from the C-arm fluoroscopic device were submitted for interpretation post-operatively. Please see the procedural report for the amount of contrast and the fluoroscopy time utilized. COMPARISON:  CT scan of the abdomen and pelvis and abdominal ultrasound 03/29/2018 FINDINGS: Several intraoperative saved images were obtained during intraoperative cholangiogram at the time of laparoscopic cholecystectomy. The images demonstrate cannulation of the cystic duct remanent and opacification of the biliary tree. No significant biliary ductal dilatation, stenosis, stricture or convincing evidence of choledocholithiasis. However, there is no definite passage of contrast material through the ampulla. IMPRESSION: 1. No definite passage of contrast material through the ampulla and into the duodenum. Ampullary dysfunction, stenosis or stricture is difficult to exclude entirely. 2. Otherwise, negative intraoperative cholangiogram. No significant biliary ductal dilatation or evidence of choledocholithiasis. Electronically Signed   By: Malachy Moan M.D.   On: 03/30/2018 12:15   US Abdomen Limited Ruq  Result Date: 03/29/2018 CLINICAL DATA:  Elevated liver function tests. Evaluate for cholelithiasis. EXAM: ULTRASOUND ABDOMEN LIMITED RIGHT UPPER QUADRANT COMPARISON:  Abdominal CT from earlier today FINDINGS: Gallbladder: Numerous layering calculi that are small. Gallbladder is full but there is no wall thickening or focal  tenderness. Common bile duct: Diameter: 5 mm Liver: No focal lesion identified. Within normal limits in parenchymal echogenicity. Portal vein is patent on color Doppler imaging with normal direction of blood flow towards the liver. IMPRESSION: 1. Numerous small calculi. 2. No acute finding. Electronically Signed   By: Marnee SpringJonathon   Watts M.D.   On: 03/29/2018 10:27    Anti-infectives: Anti-infectives (From admission, onward)   Start     Dose/Rate Route Frequency Ordered Stop   03/31/18 1000  cefTRIAXone (ROCEPHIN) 2 g in sodium chloride 0.9 % 100 mL IVPB     2 g 200 mL/hr over 30 Minutes Intravenous Every 24 hours 03/30/18 2005     03/30/18 0915  cefTRIAXone (ROCEPHIN) 2 g in sodium chloride 0.9 % 100 mL IVPB     2 g 200 mL/hr over 30 Minutes Intravenous On call to O.R. 03/30/18 0847 03/30/18 1103       Assessment/Plan  Biliary pancreatitis, POD 1 s/p lap chole with IOC, Dr. Maisie Fushomas 9-11  -adv to low fat diet -Maalox for gas as she takes this at home. -mobilize -pulm toilet with IS  E. Coli Bacteremia -unlikely to be from gallbladder as she had no evidence of cholecystitis.  UA is essentially negative.  Would be odd from pancreatitis given this was not infectious in nature. -currently on Rocephin, defer further abx duration and management to primary team  Mirgraines/HA -patient takes meclizine at home for dizziness and HA apparently.  Will reorder this. -will also schedule some tylenol for postop pain as well as her headaches  FEN - low fat diet VTE - Lovenox/SCDs ID - Rocephin   LOS: 2 days    Letha CapeKelly E Johnson [place] , [facility] 03/31/2018, 9:25 AM Pager: [phone]

## 2018-04-01 MED ORDER — CEFDINIR 300 MG PO CAPS: 300.0000 mg | ORAL_CAPSULE | Freq: Two times a day (BID) | ORAL | 0 refills | Status: AC

## 2018-04-01 MED ORDER — CEFDINIR 300 MG PO CAPS
300.0000 mg | ORAL_CAPSULE | Freq: Two times a day (BID) | ORAL | Status: DC
  Administered 2018-04-01: 300 mg via ORAL
  Filled 2018-04-01: qty 1

## 2018-04-01 MED ORDER — FAMOTIDINE 20 MG PO TABS: 20.0000 mg | ORAL_TABLET | Freq: Two times a day (BID) | ORAL | 0 refills | Status: AC

## 2018-04-01 MED ORDER — FAMOTIDINE 20 MG PO TABS
20.0000 mg | ORAL_TABLET | Freq: Two times a day (BID) | ORAL | Status: DC
  Administered 2018-04-01: 20 mg via ORAL
  Filled 2018-04-01: qty 1

## 2018-04-01 NOTE — Care Management Important Message (Signed)
Important Message  Patient Details  Name: Jody Cordova MRN: [id number] Date of Birth: 04/14/1941   Medicare Important Message Given:  Yes    Caren MacadamFuller, Kiyoko Mcguirt 04/01/2018, 10:48 AMImportant Message  Patient Details  Name: Jody PippinsMary Cordova MRN: [id number] Date of Birth: 11/18/1940   Medicare Important Message Given:  Yes    Caren MacadamFuller, Meggin Ola 04/01/2018, 10:48 AM

## 2018-04-01 NOTE — Discharge Summary (Signed)
Physician Discharge Summary  Jody Cordova ZOX:[id number] DOB: 1941-07-16 DOA: 03/29/2018  PCP: System, Pcp Not In  Admit date: 03/29/2018 Discharge date: 04/01/2018  Admitted From: Home Disposition:  Home  Discharge Condition:Stable CODE STATUS:FULL Diet recommendation: Heart Healthy  Brief/Interim Summary: Patient is a 77 y.o.femalewith medical history significant forhypertension, migraine, GERD presents to the ED complaining of abdominal pain and non-bloody vomiting for 1 day. Abdominal pain was located in the epigastric region, sharp, radiating to the back with associated nausea and nonbloody vomiting. Patient is from [place] visiting her daughter here. Patient denied any alcohol use. CT abdomen done on presentation showed acute pancreatitis without peripancreatic fluid collection or necrosis.  Ultrasound of the gallbladder showed numerous gallstones.  Surgery consulted  for biliary pancreatitis. Underwent laparascopic cholecystectomy.  Her blood cultures also grew E. coli and she is currently on antibiotics. She is hemodynamically stable today.  She is stable for discharge to home today on oral antibiotics.  Following problems were addressed during her hospitalization:   Acute biliary pancreatitis Lipase elevated at 1089, AST 97, ALT 53 on admission. Denies any alcohol use,Normal lipid panel  CT abdomen showed acute pancreatitis without peripancreatic fluid collection or evidence of pancreatic necrosis. Mild gallbladder distention with layering hyperdensity, small stones versus sludge.No biliary dilatation. RUQ ultrasound showed cholelithiasis Patient's abdominal pain has rimproved.  Lipase has trended down. Surgery consulted for biliary pancreatitis.  Underwent laparoscopic cholecystectomy on 03/30/18. Her diet was advanced and she tolerated well.  E. coli bacteremia:   She did not have any signs of cholecystitis.  Will presume the source is biliary.  UA not  impressive.  She wasceftriaxone.  We changed the antibiotics to oral .  She is currently hemodynamically stable, no signs of sepsis.  GERD Continue Pepcid  Hypertension Stable.Resumed home medications on discharge.  Migraine Continue verapamil   Hypokalemia/hypomagnesemia: Supplemented and corrected  Discharge Diagnoses:  Principal Problem:   Pancreatitis Active Problems:   GERD (gastroesophageal reflux disease)   Essential hypertension   Migraine   Bacteremia    Discharge Instructions  Discharge Instructions    Diet - low sodium heart healthy   Complete by:  As directed    Discharge instructions   Complete by:  As directed    1) Please take prescribed medications as instructed. 2)Follow up with your PCP in a week. Do CBC and BMP tests during the follow up.   Increase activity slowly   Complete by:  As directed      Allergies as of 04/01/2018   No Known Allergies     Medication List    TAKE these medications   alendronate 70 MG tablet Commonly known as:  FOSAMAX Take 70 mg by mouth every Monday. Take with a full glass of water on an empty stomach.   alum & mag hydroxide-simeth 200-200-20 MG/5ML suspension Commonly known as:  MAALOX/MYLANTA Take 30 mLs by mouth every 6 (six) hours as needed for indigestion or heartburn.   aspirin EC 81 MG tablet Take 81 mg by mouth daily.   cefdinir 300 MG capsule Commonly known as:  OMNICEF Take 1 capsule (300 mg total) by mouth every 12 (twelve) hours.   cetirizine 10 MG tablet Commonly known as:  ZYRTEC Take 10 mg by mouth daily.   famotidine 20 MG tablet Commonly known as:  PEPCID Take 1 tablet (20 mg total) by mouth 2 (two) times daily.   fluticasone 50 MCG/ACT nasal spray Commonly known as:  FLONASE Place 1 spray into both  nostrils daily as needed for allergies or rhinitis.   hydrOXYzine 25 MG tablet Commonly known as:  ATARAX/VISTARIL Take 25 mg by mouth 2 (two) times daily as needed for anxiety or  itching.   lisinopril-hydrochlorothiazide 20-25 MG tablet Commonly known as:  PRINZIDE,ZESTORETIC Take 1 tablet by mouth daily.   meclizine 12.5 MG tablet Commonly known as:  ANTIVERT Take 12.5 mg by mouth 3 (three) times daily as needed for dizziness.   pantoprazole 40 MG tablet Commonly known as:  PROTONIX Take 40 mg by mouth daily.   verapamil 40 MG tablet Commonly known as:  CALAN Take 40 mg by mouth daily.      Follow-up Information    [facility] Follow up.   Specialty:  General Surgery Why:  call as needed with questions Contact information: [address] [place] [address] [phone]       Primary care physician. Schedule an appointment as soon as possible for a visit in 2 week(s).   Why:  Please follow up with your primary doctor in 2 weeks for him/her to assess your surgical incisions and how you are doing overall.         No Known Allergies  Consultations: General Surgery  Procedures/Studies: Dg Cholangiogram Operative  Result Date: 03/30/2018 CLINICAL DATA:  77 year old female undergoing laparoscopic cholecystectomy for cholelithiasis. EXAM: INTRAOPERATIVE CHOLANGIOGRAM TECHNIQUE: Cholangiographic images from the C-arm fluoroscopic device were submitted for interpretation post-operatively. Please see the procedural report for the amount of contrast and the fluoroscopy time utilized. COMPARISON:  CT scan of the abdomen and pelvis and abdominal ultrasound 03/29/2018 FINDINGS: Several intraoperative saved images were obtained during intraoperative cholangiogram at the time of laparoscopic cholecystectomy. The images demonstrate cannulation of the cystic duct remanent and opacification of the biliary tree. No significant biliary ductal dilatation, stenosis, stricture or convincing evidence of choledocholithiasis. However, there is no definite passage of contrast material through the ampulla. IMPRESSION: 1. No  definite passage of contrast material through the ampulla and into the duodenum. Ampullary dysfunction, stenosis or stricture is difficult to exclude entirely. 2. Otherwise, negative intraoperative cholangiogram. No significant biliary ductal dilatation or evidence of choledocholithiasis. Electronically Signed   By: Malachy Moan M.D.   On: 03/30/2018 12:15   Ct Abdomen Pelvis W Contrast  Result Date: 03/29/2018 CLINICAL DATA:  Acute abdominal pain.  Nausea and vomiting. EXAM: CT ABDOMEN AND PELVIS WITH CONTRAST TECHNIQUE: Multidetector CT imaging of the abdomen and pelvis was performed using the standard protocol following bolus administration of intravenous contrast. CONTRAST:  ISOVUE-300 IOPAMIDOL (ISOVUE-300) INJECTION 61% COMPARISON:  None. FINDINGS: Lower chest: Mild right lower lobe bronchiectasis and basilar scarring. No consolidation. No pleural fluid. Hepatobiliary: Few scattered subcentimeter hepatic hypodensities are too small to accurately characterize. Mild gallbladder distention with layering hyperdensity, small stones versus sludge. No biliary dilatation. Pancreas: Mild peripancreatic fat stranding about the pancreatic head. No evidence pancreatic necrosis, ductal dilatation, or acute peripancreatic fluid collection. Spleen: Normal in size without focal abnormality. Adrenals/Urinary Tract: 3.4 cm low-density left adrenal nodule consistent with adenoma. Right adrenal gland is normal. No hydronephrosis or perinephric edema. Early excretion of IV contrast in the renal collecting systems bilaterally. Urinary bladder is physiologically distended. No bladder wall thickening. Stomach/Bowel: Small hiatal hernia. Detailed bowel evaluation limited in the absence of enteric contrast. Multifocal colonic diverticulosis without diverticulitis. Normal appendix. No small bowel dilatation or inflammation. Vascular/Lymphatic: Minimal aortic atherosclerosis without aneurysm. Splenic vein is patent. No  enlarged abdominal or pelvic  lymph nodes. Reproductive: Post hysterectomy. Ovaries tentatively identified and quiescent. No suspicious adnexal mass. Other: No free air, free fluid, or intra-abdominal fluid collection. Musculoskeletal: Scoliotic curvature in degenerative change in the spine. IMPRESSION: 1. Acute pancreatitis without peripancreatic fluid collection or evidence of pancreatic necrosis. 2. Layering stones or sludge in the gallbladder. 3. Multifocal colonic diverticulosis without diverticulitis. 4. Small hiatal hernia.  Incidental left adrenal adenoma. Electronically Signed   By: Narda Rutherford M.D.   On: 03/29/2018 05:39   Dg Chest Port 1 View  Result Date: 03/29/2018 CLINICAL DATA:  Elevated liver function studies EXAM: PORTABLE CHEST 1 VIEW COMPARISON:  None. FINDINGS: The lungs are adequately inflated and clear. The heart and pulmonary vascularity are normal. The mediastinum is normal in width. There is tortuosity of the ascending and descending thoracic aorta. There is multilevel degenerative disc disease of the thoracic spine. IMPRESSION: There is no active cardiopulmonary disease. Electronically Signed   By: David  [place] M.D.   On: 03/29/2018 09:20   US Abdomen Limited Ruq  Result Date: 03/29/2018 CLINICAL DATA:  Elevated liver function tests. Evaluate for cholelithiasis. EXAM: ULTRASOUND ABDOMEN LIMITED RIGHT UPPER QUADRANT COMPARISON:  Abdominal CT from earlier today FINDINGS: Gallbladder: Numerous layering calculi that are small. Gallbladder is full but there is no wall thickening or focal tenderness. Common bile duct: Diameter: 5 mm Liver: No focal lesion identified. Within normal limits in parenchymal echogenicity. Portal vein is patent on color Doppler imaging with normal direction of blood flow towards the liver. IMPRESSION: 1. Numerous small calculi. 2. No acute finding. Electronically Signed   By: Marnee Spring M.D.   On: 03/29/2018 10:27       Subjective: Patient seen  and examined the bedside this morning.  Remains comfortable.  Denies any abdominal pain nausea or vomiting.  Hemodynamically stable for discharge.  Discharge Exam: Vitals:   03/31/18 2102 04/01/18 0531  BP: 127/62 121/76  Pulse: 79 73  Resp: 20 16  Temp: 98.7 F (37.1 C) 98.9 F (37.2 C)  SpO2: 94% 95%   Vitals:   03/31/18 1058 03/31/18 1300 03/31/18 2102 04/01/18 0531  BP: 116/69 116/69 127/62 121/76  Pulse: 84 69 79 73  Resp: 16 16 20 16   Temp: 98.5 F (36.9 C) 98.7 F (37.1 C) 98.7 F (37.1 C) 98.9 F (37.2 C)  TempSrc: Oral Oral Oral   SpO2: 100% 98% 94% 95%  Weight:      Height:        General: Pt is alert, awake, not in acute distress Cardiovascular: RRR, S1/S2 +, no rubs, no gallops Respiratory: CTA bilaterally, no wheezing, no rhonchi Abdominal: Soft, NT, ND, bowel sounds + Extremities: no edema, no cyanosis    The results of significant diagnostics from this hospitalization (including imaging, microbiology, ancillary and laboratory) are listed below for reference.     Microbiology: Recent Results (from the past 240 hour(s))  Culture, blood (routine x 2)     Status: Abnormal (Preliminary result)   Collection Time: 03/30/18  4:38 AM  Result Value Ref Range Status   Specimen Description BLOOD LEFT ANTECUBITAL  Final   Special Requests   Final    BOTTLES DRAWN AEROBIC AND ANAEROBIC Blood Culture adequate volume   Culture  Setup Time   Final    IN BOTH AEROBIC AND ANAEROBIC BOTTLES GRAM NEGATIVE RODS CRITICAL RESULT CALLED TO, READ BACK BY AND VERIFIED WITH: T GREEN PHARMD 03/30/18 1954 JDW    Culture ESCHERICHIA COLI (A)  Final  Report Status PENDING  Incomplete   Organism ID, Bacteria ESCHERICHIA COLI  Final      Susceptibility   Escherichia coli - MIC*    AMPICILLIN <=2 SENSITIVE Sensitive     CEFAZOLIN <=4 SENSITIVE Sensitive     CEFEPIME <=1 SENSITIVE Sensitive     CEFTAZIDIME <=1 SENSITIVE Sensitive     CEFTRIAXONE <=1 SENSITIVE Sensitive      CIPROFLOXACIN <=0.25 SENSITIVE Sensitive     GENTAMICIN <=1 SENSITIVE Sensitive     IMIPENEM <=0.25 SENSITIVE Sensitive     TRIMETH/SULFA <=20 SENSITIVE Sensitive     AMPICILLIN/SULBACTAM <=2 SENSITIVE Sensitive     PIP/TAZO <=4 SENSITIVE Sensitive     Extended ESBL Value in next row Sensitive      NEGATIVEPerformed at [facility], [address] [place], [place] [address]    * ESCHERICHIA COLI  Culture, blood (routine x 2)     Status: None (Preliminary result)   Collection Time: 03/30/18  4:38 AM  Result Value Ref Range Status   Specimen Description   Final    BLOOD RIGHT HAND Performed at [facility], [address] [place], [place] [address]    Special Requests   Final    BOTTLES DRAWN AEROBIC ONLY Blood Culture results may not be optimal due to an inadequate volume of blood received in culture bottles   Culture   Final    NO GROWTH 2 DAYS Performed at [facility], [address] [place], [place] [address]    Report Status PENDING  Incomplete  Blood Culture ID Panel (Reflexed)     Status: Abnormal   Collection Time: 03/30/18  4:38 AM  Result Value Ref Range Status   Enterococcus species NOT DETECTED NOT DETECTED Final   Listeria monocytogenes NOT DETECTED NOT DETECTED Final   Staphylococcus species NOT DETECTED NOT DETECTED Final   Staphylococcus aureus NOT DETECTED NOT DETECTED Final   Streptococcus species NOT DETECTED NOT DETECTED Final   Streptococcus agalactiae NOT DETECTED NOT DETECTED Final   Streptococcus pneumoniae NOT DETECTED NOT DETECTED Final   Streptococcus pyogenes NOT DETECTED NOT DETECTED Final   Acinetobacter baumannii NOT DETECTED NOT DETECTED Final   Enterobacteriaceae species DETECTED (A) NOT DETECTED Final    Comment: Enterobacteriaceae represent a large family of gram-negative bacteria, not a single organism. CRITICAL RESULT CALLED TO, READ BACK BY AND VERIFIED WITH: T GREEN PHARMD 03/30/18 1954 JDW    Enterobacter  cloacae complex NOT DETECTED NOT DETECTED Final   Escherichia coli DETECTED (A) NOT DETECTED Final    Comment: CRITICAL RESULT CALLED TO, READ BACK BY AND VERIFIED WITH: T GREEN PHARMD 03/30/18 1954 JDW    Klebsiella oxytoca NOT DETECTED NOT DETECTED Final   Klebsiella pneumoniae NOT DETECTED NOT DETECTED Final   Proteus species NOT DETECTED NOT DETECTED Final   Serratia marcescens NOT DETECTED NOT DETECTED Final   Carbapenem resistance NOT DETECTED NOT DETECTED Final   Haemophilus influenzae NOT DETECTED NOT DETECTED Final   Neisseria meningitidis NOT DETECTED NOT DETECTED Final   Pseudomonas aeruginosa NOT DETECTED NOT DETECTED Final   Candida albicans NOT DETECTED NOT DETECTED Final   Candida glabrata NOT DETECTED NOT DETECTED Final   Candida krusei NOT DETECTED NOT DETECTED Final   Candida parapsilosis NOT DETECTED NOT DETECTED Final   Candida tropicalis NOT DETECTED NOT DETECTED Final    Comment: Performed at [facility], [address] [place], [place] [address]  MRSA PCR Screening  Status: None   Collection Time: 03/30/18  9:06 AM  Result Value Ref Range Status   MRSA by PCR NEGATIVE NEGATIVE Final    Comment:        The GeneXpert MRSA Assay (FDA approved for NASAL specimens only), is one component of a comprehensive MRSA colonization surveillance program. It is not intended to diagnose MRSA infection nor to guide or monitor treatment for MRSA infections. Performed at [facility], [address] [place], [place] [address]      Labs: BNP (last 3 results) No results for input(s): BNP in the last 8760 hours. Basic Metabolic Panel: Recent Labs  Lab 03/29/18 0253 03/30/18 0438 03/31/18 0358  NA 139 140 139  K 4.2 3.0* 3.5  CL 102 105 104  CO2 24 26 24   GLUCOSE 147* 124* 160*  BUN 33* 18 14  CREATININE 0.94 0.85 0.84  CALCIUM 9.3 8.3* 7.8*  MG  --  1.6* 2.1   Liver Function Tests: Recent Labs  Lab 03/29/18 0253 03/30/18 0438  03/31/18 0358  AST 97* 34 88*  ALT 53* 40 103*  ALKPHOS 48 38 58  BILITOT 1.2 1.3* 0.8  PROT 7.9 6.3* 6.0*  ALBUMIN 4.3 3.3* 2.9*   Recent Labs  Lab 03/29/18 0253 03/30/18 0438  LIPASE 1,089* 95*   No results for input(s): AMMONIA in the last 168 hours. CBC: Recent Labs  Lab 03/29/18 0253 03/30/18 0438  WBC 7.2 5.4  HGB 11.9* 10.0*  HCT 35.2* 30.0*  MCV 86.9 87.0  PLT 267 182   Cardiac Enzymes: No results for input(s): CKTOTAL, CKMB, CKMBINDEX, TROPONINI in the last 168 hours. BNP: Invalid input(s): POCBNP CBG: No results for input(s): GLUCAP in the last 168 hours. D-Dimer No results for input(s): DDIMER in the last 72 hours. Hgb A1c No results for input(s): HGBA1C in the last 72 hours. Lipid Profile No results for input(s): CHOL, HDL, LDLCALC, TRIG, CHOLHDL, LDLDIRECT in the last 72 hours. Thyroid function studies No results for input(s): TSH, T4TOTAL, T3FREE, THYROIDAB in the last 72 hours.  Invalid input(s): FREET3 Anemia work up No results for input(s): VITAMINB12, FOLATE, FERRITIN, TIBC, IRON, RETICCTPCT in the last 72 hours. Urinalysis    Component Value Date/Time   COLORURINE YELLOW 03/29/2018 1324   APPEARANCEUR CLEAR 03/29/2018 1324   LABSPEC 1.035 (H) 03/29/2018 1324   PHURINE 5.0 03/29/2018 1324   GLUCOSEU NEGATIVE 03/29/2018 1324   HGBUR MODERATE (A) 03/29/2018 1324   BILIRUBINUR NEGATIVE 03/29/2018 1324   KETONESUR NEGATIVE 03/29/2018 1324   PROTEINUR NEGATIVE 03/29/2018 1324   NITRITE NEGATIVE 03/29/2018 1324   LEUKOCYTESUR TRACE (A) 03/29/2018 1324   Sepsis Labs Invalid input(s): PROCALCITONIN,  WBC,  LACTICIDVEN Microbiology Recent Results (from the past 240 hour(s))  Culture, blood (routine x 2)     Status: Abnormal (Preliminary result)   Collection Time: 03/30/18  4:38 AM  Result Value Ref Range Status   Specimen Description BLOOD LEFT ANTECUBITAL  Final   Special Requests   Final    BOTTLES DRAWN AEROBIC AND ANAEROBIC Blood  Culture adequate volume   Culture  Setup Time   Final    IN BOTH AEROBIC AND ANAEROBIC BOTTLES GRAM NEGATIVE RODS CRITICAL RESULT CALLED TO, READ BACK BY AND VERIFIED WITH: T GREEN PHARMD 03/30/18 1954 JDW    Culture ESCHERICHIA COLI (A)  Final   Report Status PENDING  Incomplete   Organism ID, Bacteria ESCHERICHIA COLI  Final      Susceptibility   Escherichia coli -  MIC*    AMPICILLIN <=2 SENSITIVE Sensitive     CEFAZOLIN <=4 SENSITIVE Sensitive     CEFEPIME <=1 SENSITIVE Sensitive     CEFTAZIDIME <=1 SENSITIVE Sensitive     CEFTRIAXONE <=1 SENSITIVE Sensitive     CIPROFLOXACIN <=0.25 SENSITIVE Sensitive     GENTAMICIN <=1 SENSITIVE Sensitive     IMIPENEM <=0.25 SENSITIVE Sensitive     TRIMETH/SULFA <=20 SENSITIVE Sensitive     AMPICILLIN/SULBACTAM <=2 SENSITIVE Sensitive     PIP/TAZO <=4 SENSITIVE Sensitive     Extended ESBL Value in next row Sensitive      NEGATIVEPerformed at [facility], [address] [place], [place] [address]    * ESCHERICHIA COLI  Culture, blood (routine x 2)     Status: None (Preliminary result)   Collection Time: 03/30/18  4:38 AM  Result Value Ref Range Status   Specimen Description   Final    BLOOD RIGHT HAND Performed at [facility], [address] [place], [place] [address]    Special Requests   Final    BOTTLES DRAWN AEROBIC ONLY Blood Culture results may not be optimal due to an inadequate volume of blood received in culture bottles   Culture   Final    NO GROWTH 2 DAYS Performed at [facility], [address] [place], [place] [address]    Report Status PENDING  Incomplete  Blood Culture ID Panel (Reflexed)     Status: Abnormal   Collection Time: 03/30/18  4:38 AM  Result Value Ref Range Status   Enterococcus species NOT DETECTED NOT DETECTED Final   Listeria monocytogenes NOT DETECTED NOT DETECTED Final   Staphylococcus species NOT DETECTED NOT DETECTED Final   Staphylococcus aureus NOT DETECTED NOT  DETECTED Final   Streptococcus species NOT DETECTED NOT DETECTED Final   Streptococcus agalactiae NOT DETECTED NOT DETECTED Final   Streptococcus pneumoniae NOT DETECTED NOT DETECTED Final   Streptococcus pyogenes NOT DETECTED NOT DETECTED Final   Acinetobacter baumannii NOT DETECTED NOT DETECTED Final   Enterobacteriaceae species DETECTED (A) NOT DETECTED Final    Comment: Enterobacteriaceae represent a large family of gram-negative bacteria, not a single organism. CRITICAL RESULT CALLED TO, READ BACK BY AND VERIFIED WITH: T GREEN PHARMD 03/30/18 1954 JDW    Enterobacter cloacae complex NOT DETECTED NOT DETECTED Final   Escherichia coli DETECTED (A) NOT DETECTED Final    Comment: CRITICAL RESULT CALLED TO, READ BACK BY AND VERIFIED WITH: T GREEN PHARMD 03/30/18 1954 JDW    Klebsiella oxytoca NOT DETECTED NOT DETECTED Final   Klebsiella pneumoniae NOT DETECTED NOT DETECTED Final   Proteus species NOT DETECTED NOT DETECTED Final   Serratia marcescens NOT DETECTED NOT DETECTED Final   Carbapenem resistance NOT DETECTED NOT DETECTED Final   Haemophilus influenzae NOT DETECTED NOT DETECTED Final   Neisseria meningitidis NOT DETECTED NOT DETECTED Final   Pseudomonas aeruginosa NOT DETECTED NOT DETECTED Final   Candida albicans NOT DETECTED NOT DETECTED Final   Candida glabrata NOT DETECTED NOT DETECTED Final   Candida krusei NOT DETECTED NOT DETECTED Final   Candida parapsilosis NOT DETECTED NOT DETECTED Final   Candida tropicalis NOT DETECTED NOT DETECTED Final    Comment: Performed at [facility], [address], [place], [place] [address]  MRSA PCR Screening     Status: None   Collection Time: 03/30/18  9:06 AM  Result Value Ref Range Status   MRSA by PCR NEGATIVE NEGATIVE Final  Comment:        The GeneXpert MRSA Assay (FDA approved for NASAL specimens only), is one component of a comprehensive MRSA colonization surveillance program. It is not intended to diagnose  MRSA infection nor to guide or monitor treatment for MRSA infections. Performed at [facility], [address], [place], [place] [address]     Please note: You were cared for by a hospitalist during your hospital stay. Once you are discharged, your primary care physician will handle any further medical issues. Please note that NO REFILLS for any discharge medications will be authorized once you are discharged, as it is imperative that you return to your primary care physician (or establish a relationship with a primary care physician if you do not have one) for your post hospital discharge needs so that they can reassess your need for medications and monitor your lab values.    Time coordinating discharge: 40 minutes  SIGNED:   Burnadette Pop, MD  [facility] 04/01/2018, 10:31 AM Pager [phone]  If 7PM-7AM, please contact night-coverage [email or web address] Password [email or web address]

## 2018-04-01 NOTE — Progress Notes (Signed)
Patient ID: Jody Cordova, female   DOB: [birth year], 77 y.o.   MRN: [id number]    2 Days Post-Op  Subjective: Patient looks better and feels much better today.  No further HA or nausea.  Tolerating her diet, just decrease in appetite which I explained was normal.  Mobilizing well.  Saw her walk back on her own from the bathroom.  Denies any pain  Objective: Vital signs in last 24 hours: Temp:  [98.5 F (36.9 C)-98.9 F (37.2 C)] 98.9 F (37.2 C) (09/13 0531) Pulse Rate:  [69-84] 73 (09/13 0531) Resp:  [16-20] 16 (09/13 0531) BP: (116-127)/(62-76) 121/76 (09/13 0531) SpO2:  [94 %-100 %] 95 % (09/13 0531) Last BM Date: 03/30/18  Intake/Output from previous day: 09/12 0701 - 09/13 0700 In: 805 [P.O.:605; IV Piggyback:200] Out: -  Intake/Output this shift: No intake/output data recorded.  PE: Abd: soft, minimally tender over incisions, +BS, ND, incisions c/d/i with dermabond  Lab Results:  Recent Labs    03/30/18 0438  WBC 5.4  HGB 10.0*  HCT 30.0*  PLT 182   BMET Recent Labs    03/30/18 0438 03/31/18 0358  NA 140 139  K 3.0* 3.5  CL 105 104  CO2 26 24  GLUCOSE 124* 160*  BUN 18 14  CREATININE 0.85 0.84  CALCIUM 8.3* 7.8*   PT/INR No results for input(s): LABPROT, INR in the last 72 hours. CMP     Component Value Date/Time   NA 139 03/31/2018 0358   K 3.5 03/31/2018 0358   CL 104 03/31/2018 0358   CO2 24 03/31/2018 0358   GLUCOSE 160 (H) 03/31/2018 0358   BUN 14 03/31/2018 0358   CREATININE 0.84 03/31/2018 0358   CALCIUM 7.8 (L) 03/31/2018 0358   PROT 6.0 (L) 03/31/2018 0358   ALBUMIN 2.9 (L) 03/31/2018 0358   AST 88 (H) 03/31/2018 0358   ALT 103 (H) 03/31/2018 0358   ALKPHOS 58 03/31/2018 0358   BILITOT 0.8 03/31/2018 0358   GFRNONAA >60 03/31/2018 0358   GFRAA >60 03/31/2018 0358   Lipase     Component Value Date/Time   LIPASE 95 (H) 03/30/2018 0438       Studies/Results: Dg Cholangiogram Operative  Result Date: 03/30/2018 CLINICAL  DATA:  77 year old female undergoing laparoscopic cholecystectomy for cholelithiasis. EXAM: INTRAOPERATIVE CHOLANGIOGRAM TECHNIQUE: Cholangiographic images from the C-arm fluoroscopic device were submitted for interpretation post-operatively. Please see the procedural report for the amount of contrast and the fluoroscopy time utilized. COMPARISON:  CT scan of the abdomen and pelvis and abdominal ultrasound 03/29/2018 FINDINGS: Several intraoperative saved images were obtained during intraoperative cholangiogram at the time of laparoscopic cholecystectomy. The images demonstrate cannulation of the cystic duct remanent and opacification of the biliary tree. No significant biliary ductal dilatation, stenosis, stricture or convincing evidence of choledocholithiasis. However, there is no definite passage of contrast material through the ampulla. IMPRESSION: 1. No definite passage of contrast material through the ampulla and into the duodenum. Ampullary dysfunction, stenosis or stricture is difficult to exclude entirely. 2. Otherwise, negative intraoperative cholangiogram. No significant biliary ductal dilatation or evidence of choledocholithiasis. Electronically Signed   By: Malachy Moan M.D.   On: 03/30/2018 12:15    Anti-infectives: Anti-infectives (From admission, onward)   Start     Dose/Rate Route Frequency Ordered Stop   03/31/18 1000  cefTRIAXone (ROCEPHIN) 2 g in sodium chloride 0.9 % 100 mL IVPB     2 g 200 mL/hr over 30 Minutes Intravenous Every 24 hours 03/30/18 2005  03/30/18 0915  cefTRIAXone (ROCEPHIN) 2 g in sodium chloride 0.9 % 100 mL IVPB     2 g 200 mL/hr over 30 Minutes Intravenous On call to O.R. 03/30/18 0847 03/30/18 1103       Assessment/Plan Biliary pancreatitis, POD 2 s/p lap chole with IOC, Dr. Maisie Fushomas 9-11  -looks great.  Tolerating a diet and pain is controlled. -mobilize -pulm toilet with IS -surgically stable for DC home. -all DC instructions provided to  patient.  Small summary of surgery events placed in DC instruction section to provide PCP whom she should follow up with in 2 weeks at home.  E. Coli Bacteremia -unlikely to be from gallbladder as she had no evidence of cholecystitis.  UA is essentially negative.  Would be odd from pancreatitis given this was not infectious in nature. -currently on Rocephin, defer further abx duration and management to primary team  Mirgraines/HA -resolved  FEN - low fat diet VTE - Lovenox/SCDs ID - Rocephin  Plan: Surgically stable for DC home from our standpoint.  All of her DC instructions etc have been completed.  No further surgical needs.   LOS: 3 days    Letha CapeKelly E Oather [place] , [facility] 04/01/2018, 8:37 AM Pager: [phone]

## 2018-04-02 LAB — CULTURE, BLOOD (ROUTINE X 2): Special Requests: ADEQUATE

## 2018-04-04 LAB — CULTURE, BLOOD (ROUTINE X 2): Culture: NO GROWTH

## 2019-03-16 IMAGING — US US ABDOMEN LIMITED
1 series · 14 of 25 positions shown · non-contrast
Comparison: Abdominal CT from earlier today

CLINICAL DATA: Elevated liver function tests. Evaluate for
cholelithiasis.

EXAM:
ULTRASOUND ABDOMEN LIMITED RIGHT UPPER QUADRANT

[Series 1: us abdomen limited · 14 of 41 slices shown]
[im 1/41]
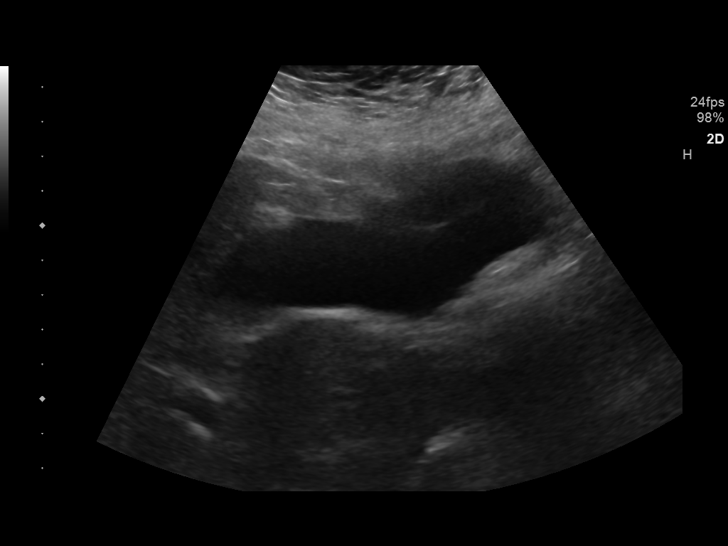
[im 4/41]
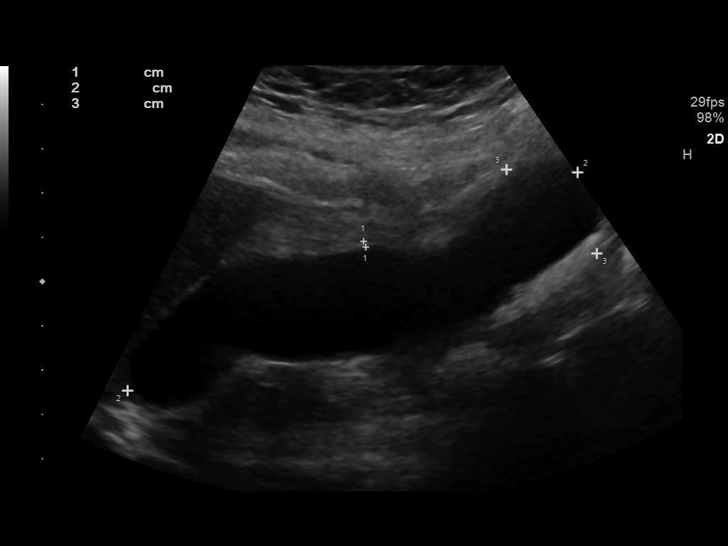
[im 7/41]
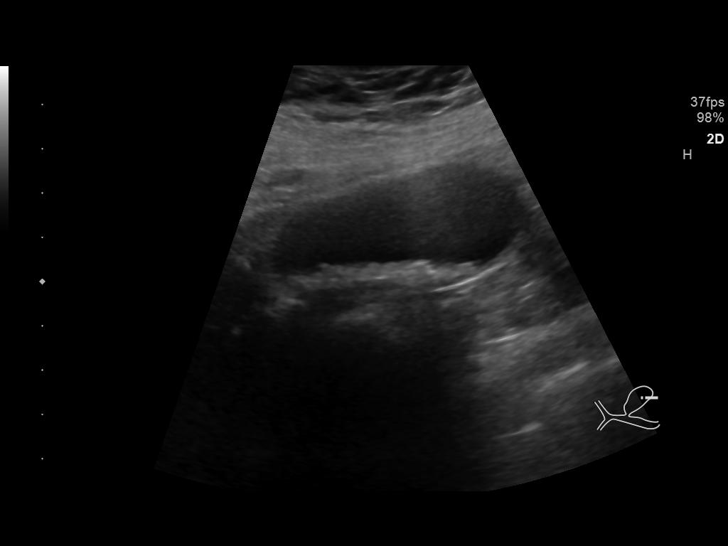
[im 11/41]
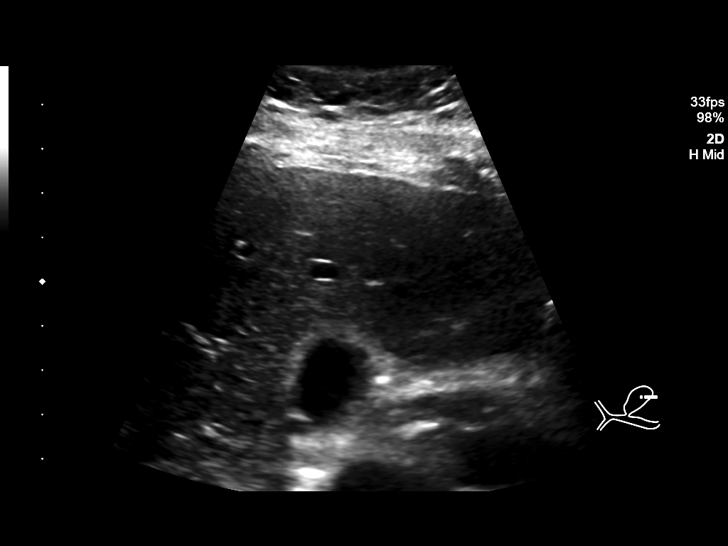
[im 14/41]
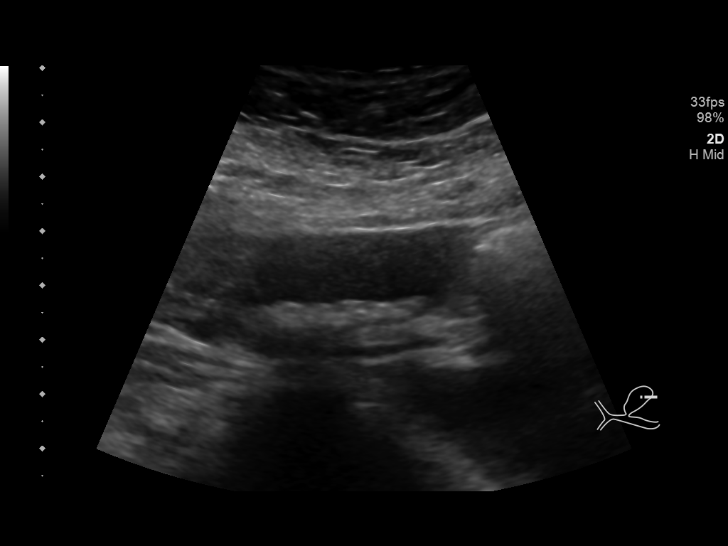
[im 16/41]
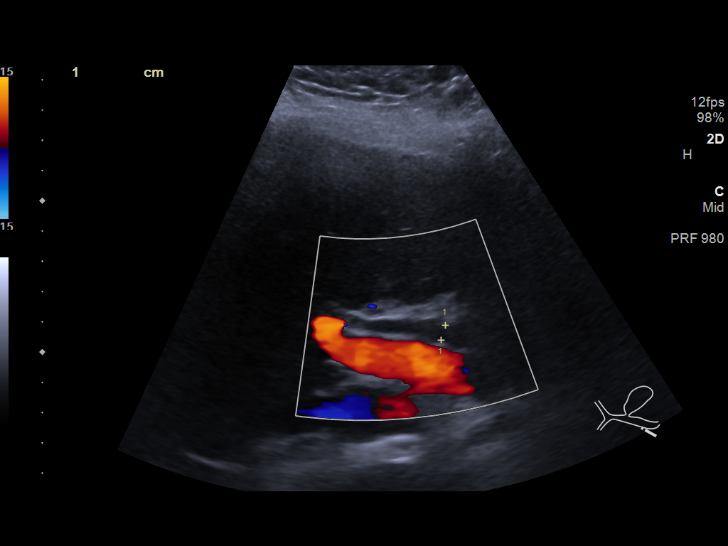
[im 19/41]
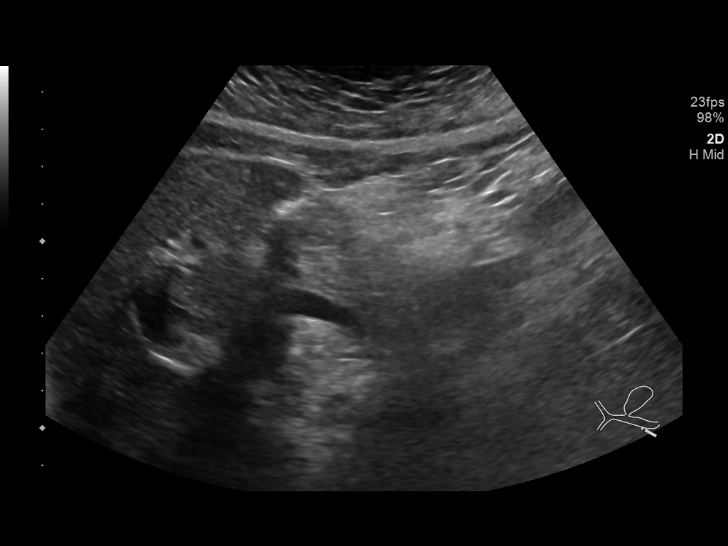
[im 22/41]
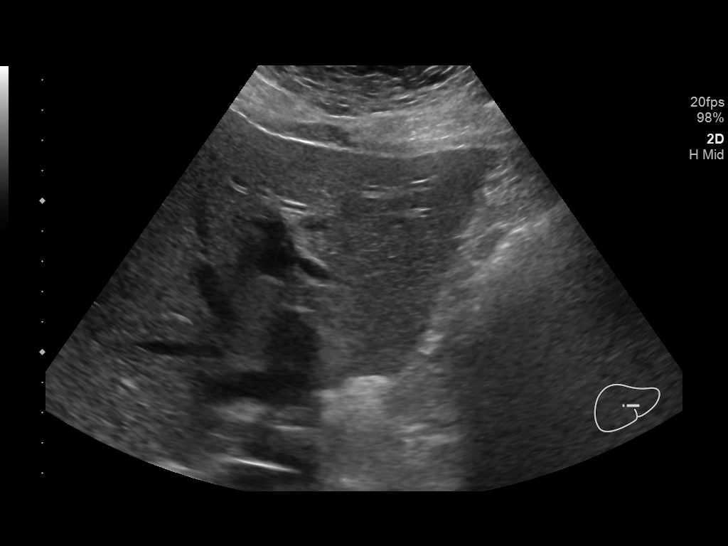
[im 26/41]
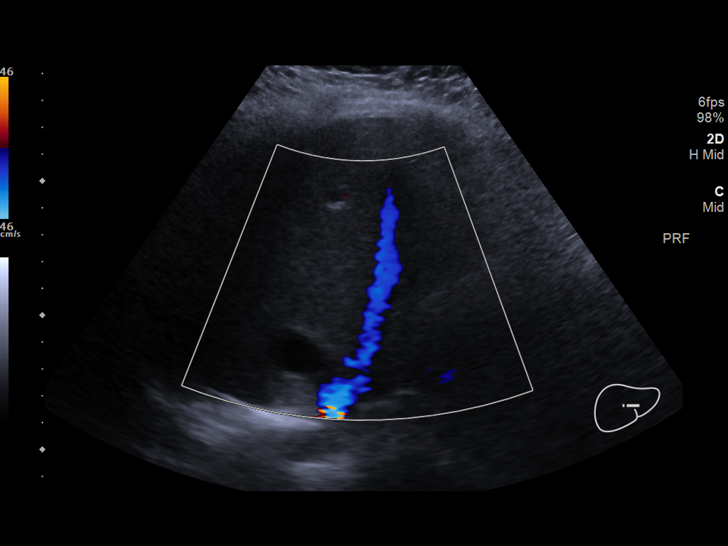
[im 27/41]
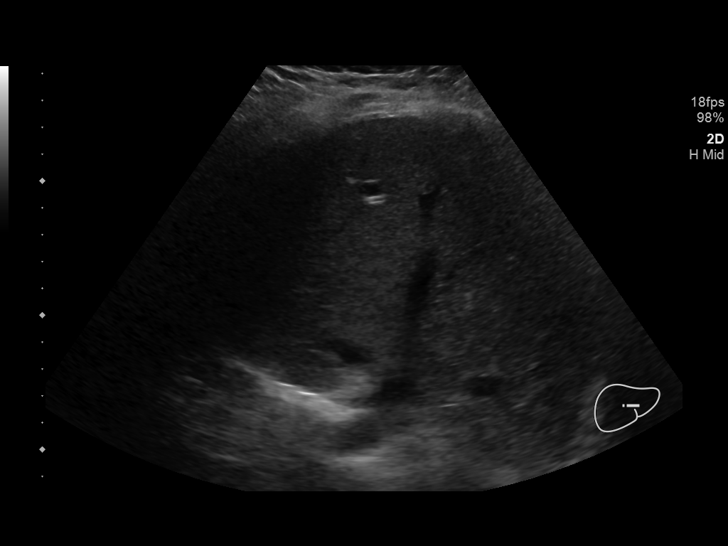
[im 31/41]
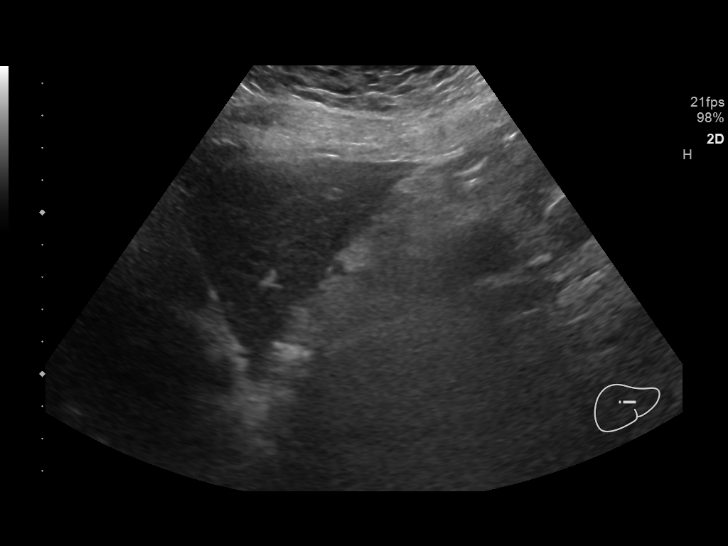
[im 34/41]
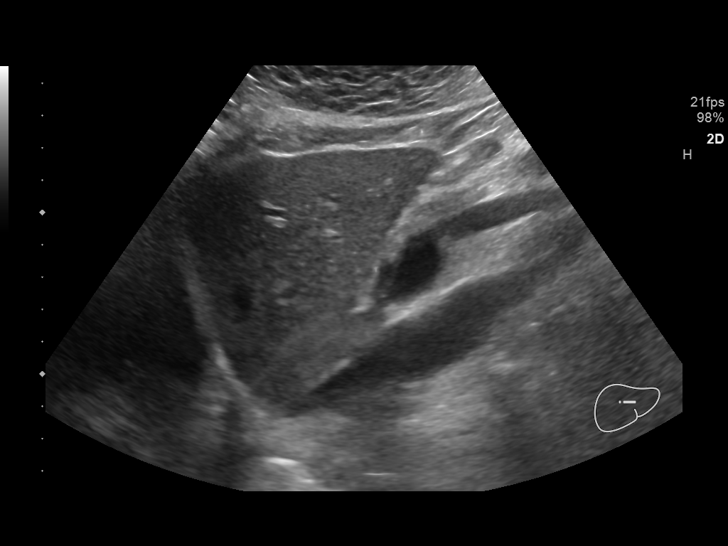
[im 37/41]
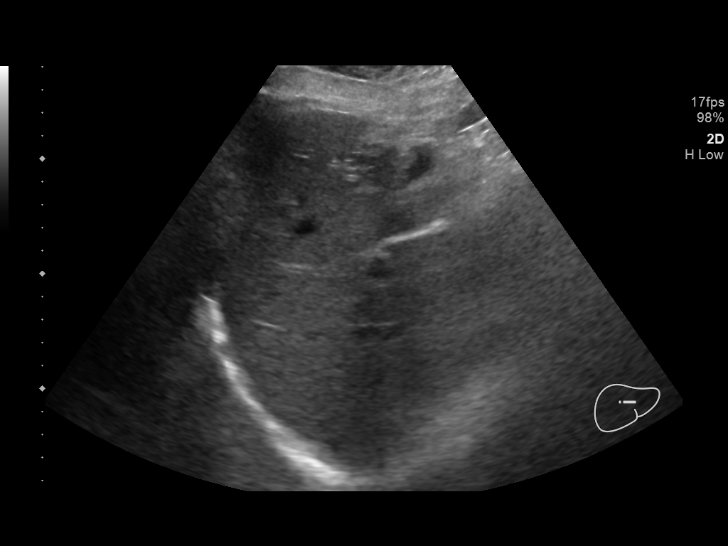
[im 41/41]
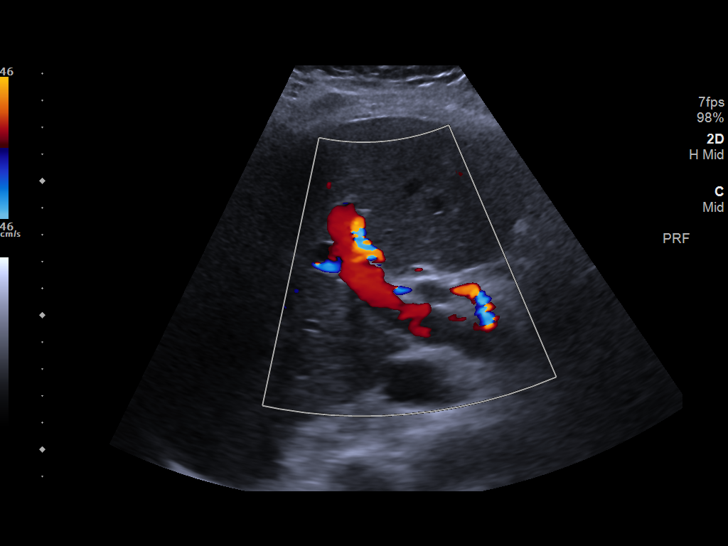

[14 of 25 positions shown; findings below may reference images not displayed]

FINDINGS: Gallbladder:

Numerous layering calculi that are small. Gallbladder is full but
there is no wall thickening or focal tenderness.

Common bile duct:

Diameter: 5 mm

Liver:

No focal lesion identified. Within normal limits in parenchymal
echogenicity. Portal vein is patent on color Doppler imaging with
normal direction of blood flow towards the liver.
IMPRESSION: 1. Numerous small calculi.
2. No acute finding.

## 2019-03-16 IMAGING — CT CT ABD-PELV W/ CM
2 of 5 series · 16 of 46 positions shown, 18 images · IV contrast (ISOVUE)
Comparison: None.

CLINICAL DATA: Acute abdominal pain.  Nausea and vomiting.

EXAM:
CT ABDOMEN AND PELVIS WITH CONTRAST
TECHNIQUE: Multidetector CT imaging of the abdomen and pelvis was performed
using the standard protocol following bolus administration of
intravenous contrast.
CONTRAST:  100mL R3QBW9-1DD IOPAMIDOL (R3QBW9-1DD) INJECTION 61%

[Series 3: axial st · axial · 0.98mm/px · z∈[+1066,+1440]mm · 13 of 89 slices shown, 15 images]
[im 7/89  soft-tissue]
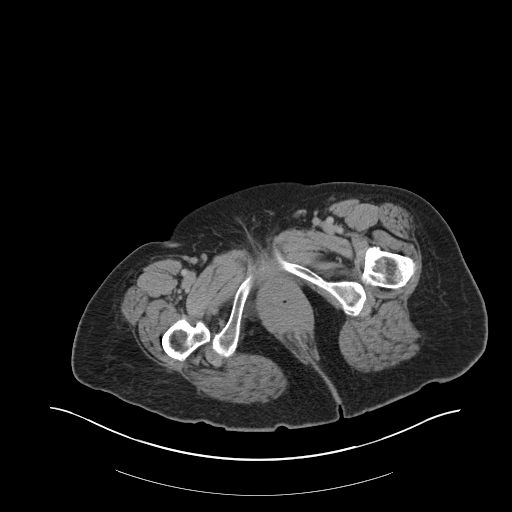
[im 7/89  bone]
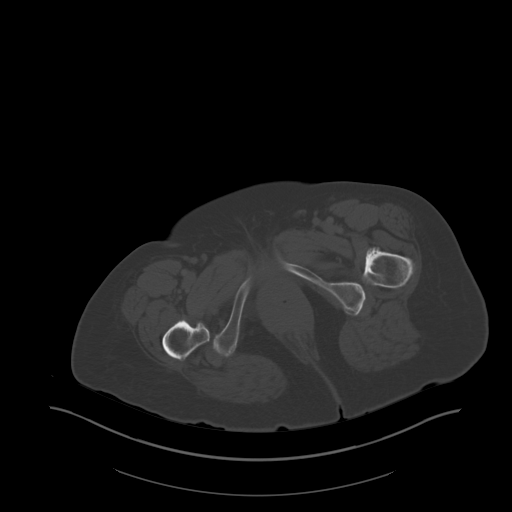
[im 13/89  soft-tissue]
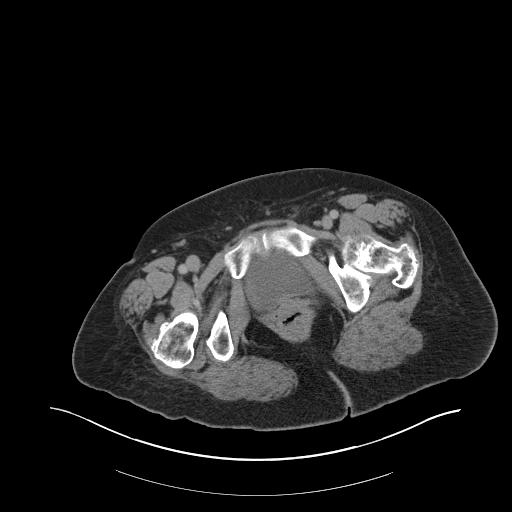
[im 19/89  soft-tissue]
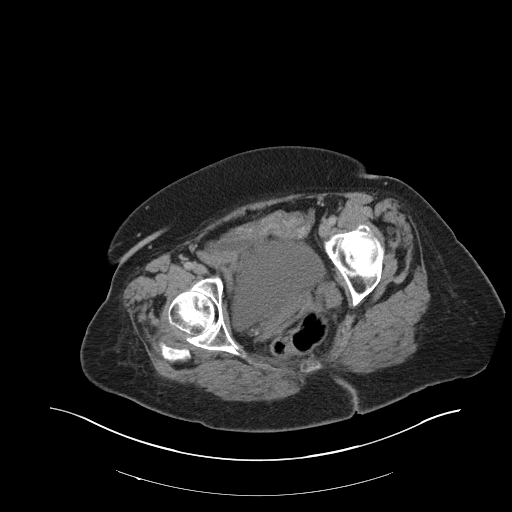
[im 26/89  soft-tissue]
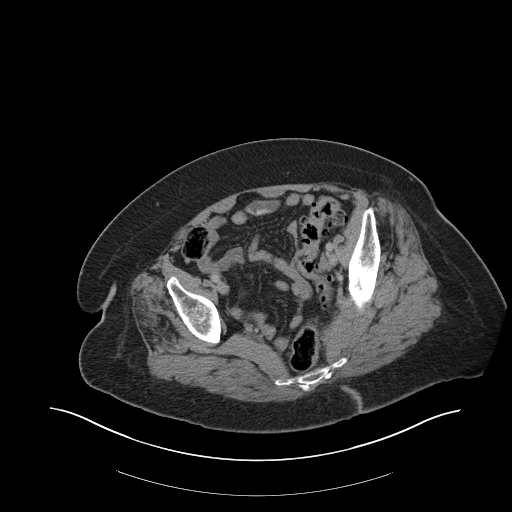
[im 32/89  soft-tissue]
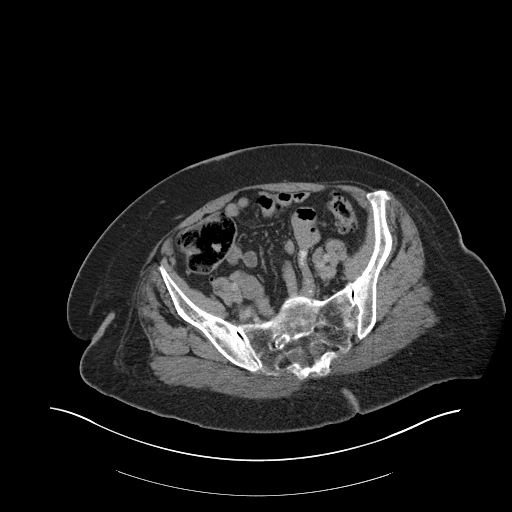
[im 38/89  soft-tissue]
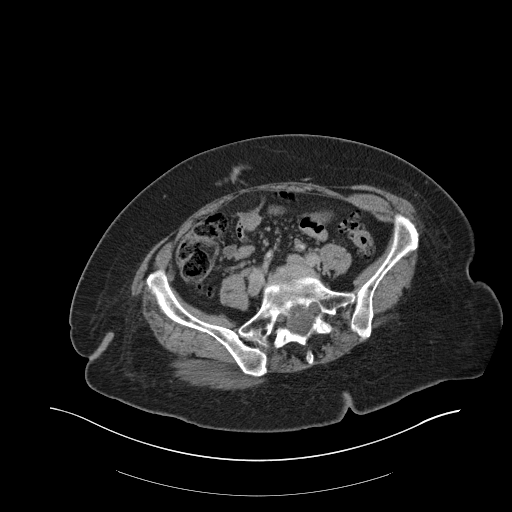
[im 45/89  soft-tissue]
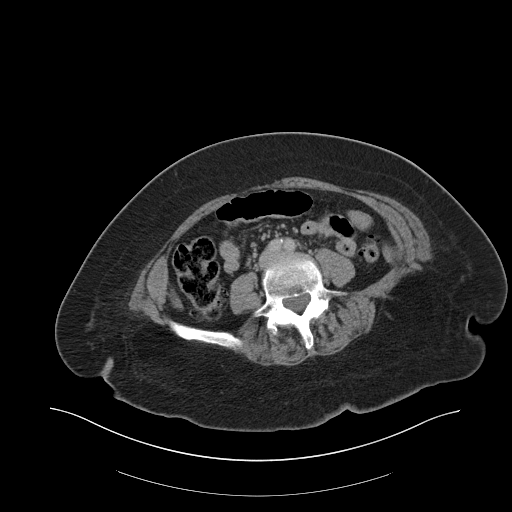
[im 51/89  soft-tissue]
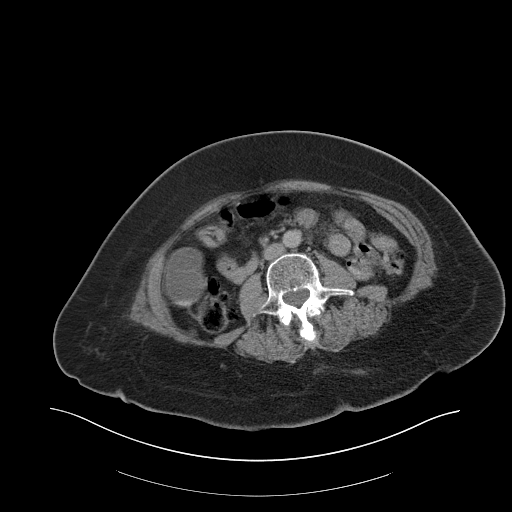
[im 57/89  soft-tissue]
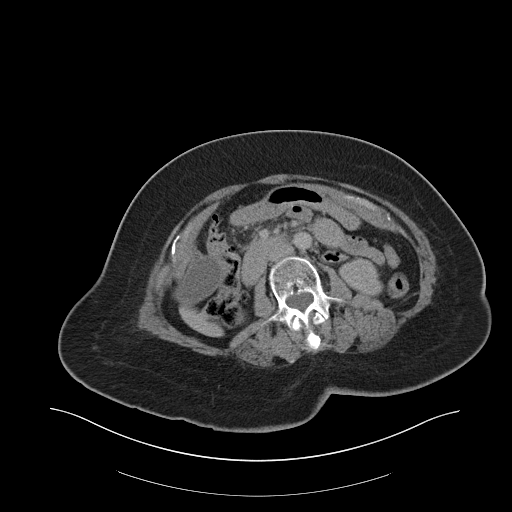
[im 57/89  bone]
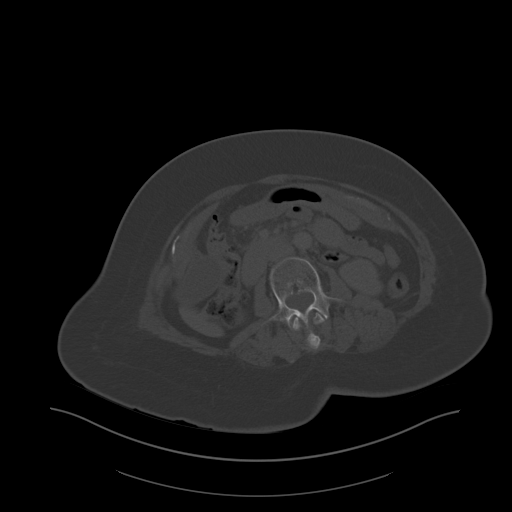
[im 63/89  soft-tissue]
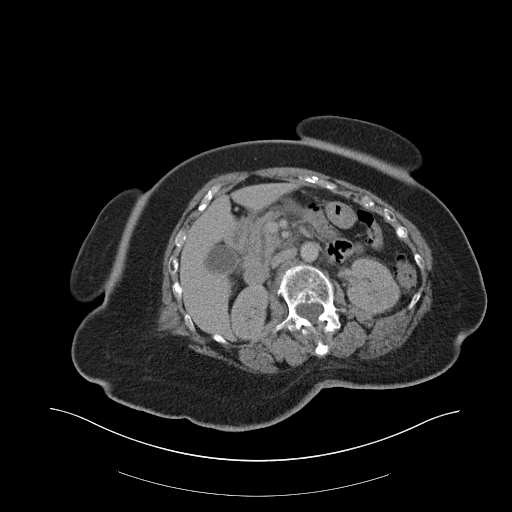
[im 70/89  soft-tissue]
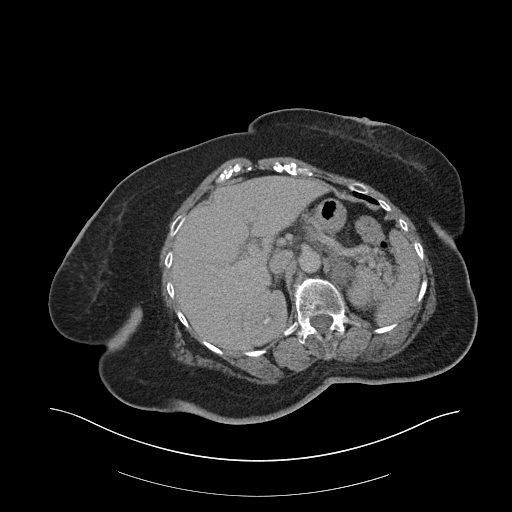
[im 76/89  soft-tissue]
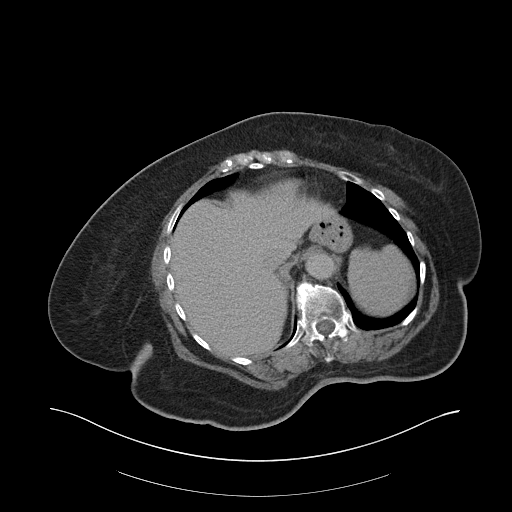
[im 82/89  soft-tissue]
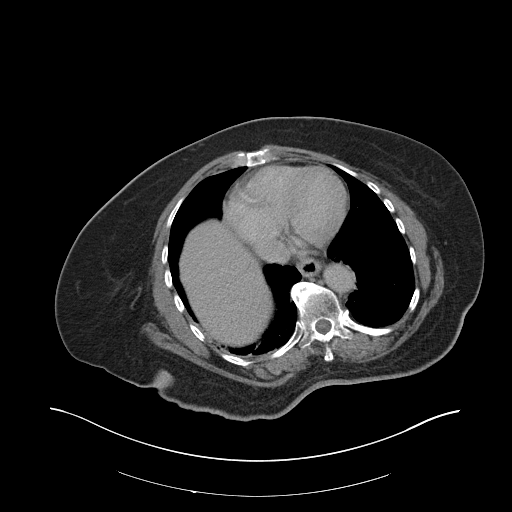

[Series 6: coronal st · coronal · 0.84mm/px · 3 of 110 slices shown]
[im 37/110  soft-tissue]
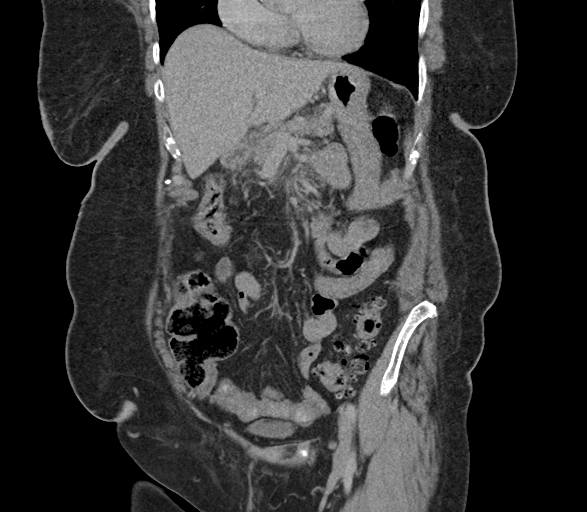
[im 49/110  soft-tissue]
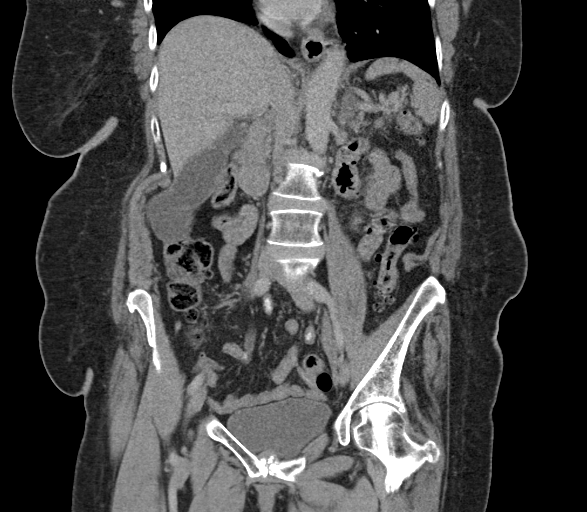
[im 61/110  soft-tissue]
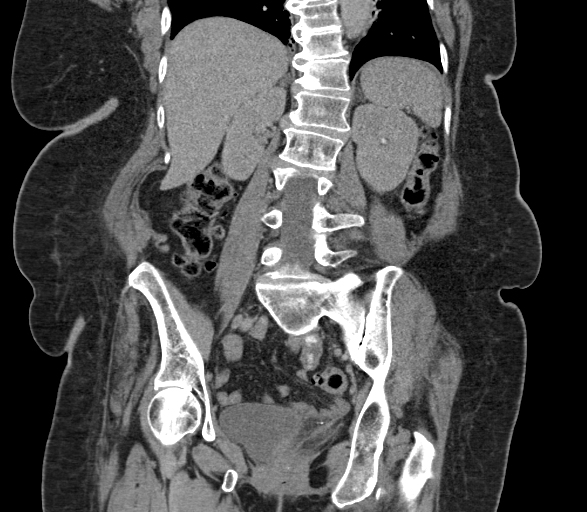

[16 of 46 positions shown; findings below may reference images not displayed]

FINDINGS: Lower chest: Mild right lower lobe bronchiectasis and basilar
scarring. No consolidation. No pleural fluid.

Hepatobiliary: Few scattered subcentimeter hepatic hypodensities are
too small to accurately characterize. Mild gallbladder distention
with layering hyperdensity, small stones versus sludge. No biliary
dilatation.

Pancreas: Mild peripancreatic fat stranding about the pancreatic
head. No evidence pancreatic necrosis, ductal dilatation, or acute
peripancreatic fluid collection.

Spleen: Normal in size without focal abnormality.

Adrenals/Urinary Tract: 3.4 cm low-density left adrenal nodule
consistent with adenoma. Right adrenal gland is normal. No
hydronephrosis or perinephric edema. Early excretion of IV contrast
in the renal collecting systems bilaterally. Urinary bladder is
physiologically distended. No bladder wall thickening.

Stomach/Bowel: Small hiatal hernia. Detailed bowel evaluation
limited in the absence of enteric contrast. Multifocal colonic
diverticulosis without diverticulitis. Normal appendix. No small
bowel dilatation or inflammation.

Vascular/Lymphatic: Minimal aortic atherosclerosis without aneurysm.
Splenic vein is patent. No enlarged abdominal or pelvic lymph nodes.

Reproductive: Post hysterectomy. Ovaries tentatively identified and
quiescent. No suspicious adnexal mass.

Other: No free air, free fluid, or intra-abdominal fluid collection.

Musculoskeletal: Scoliotic curvature in degenerative change in the
spine.
IMPRESSION: 1. Acute pancreatitis without peripancreatic fluid collection or
evidence of pancreatic necrosis.
2. Layering stones or sludge in the gallbladder.
3. Multifocal colonic diverticulosis without diverticulitis.
4. Small hiatal hernia.  Incidental left adrenal adenoma.

## 2019-03-17 IMAGING — RF DG CHOLANGIOGRAM OPERATIVE
1 series · 8 of 8 positions shown · non-contrast
Comparison: CT scan of the abdomen and pelvis and abdominal
ultrasound 03/29/2018

CLINICAL DATA: 77-year-old female undergoing laparoscopic
cholecystectomy for cholelithiasis.

EXAM:
INTRAOPERATIVE CHOLANGIOGRAM
TECHNIQUE: Cholangiographic images from the C-arm fluoroscopic device were
submitted for interpretation post-operatively. Please see the
procedural report for the amount of contrast and the fluoroscopy
time utilized.

[Series 1: run · 2 acquisitions, 8 frames shown]
[im 1/2]
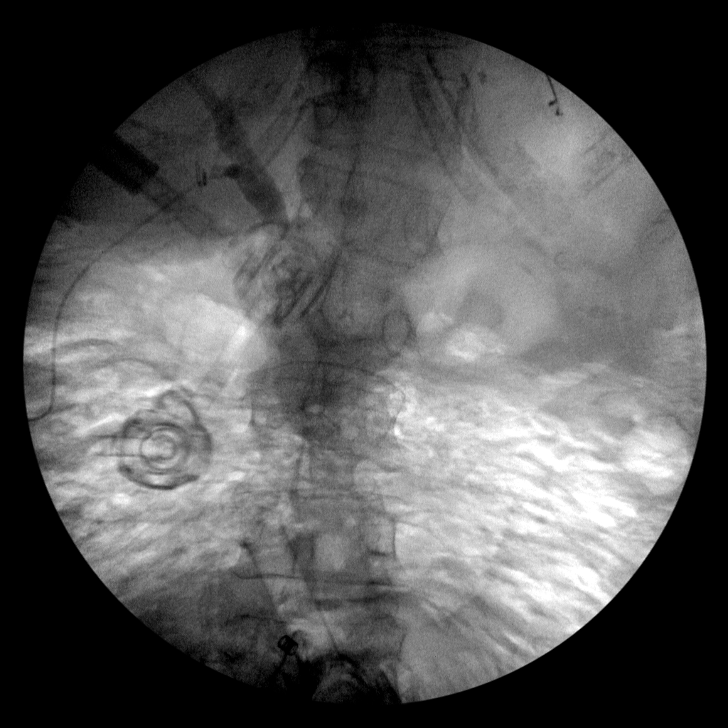
[im 1/2]
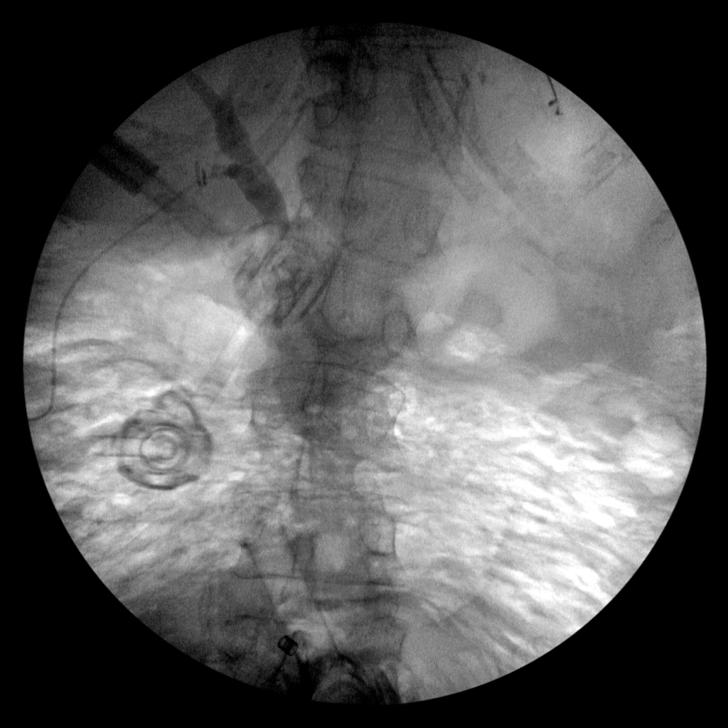
[im 1/2]
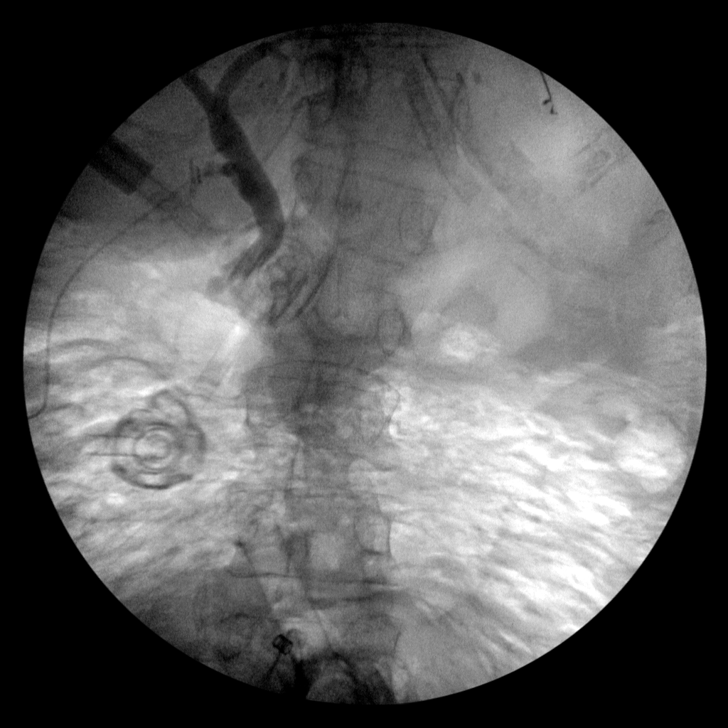
[im 1/2]
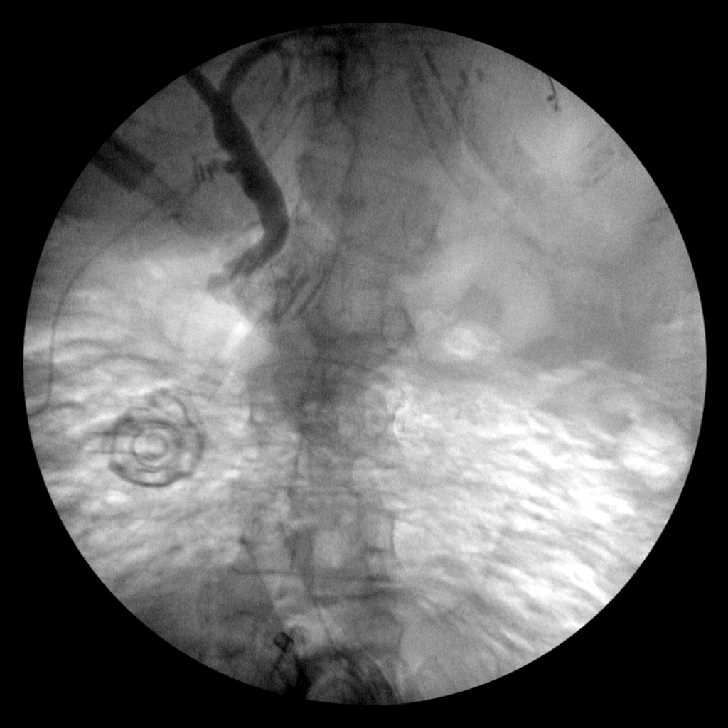
[im 2/2]
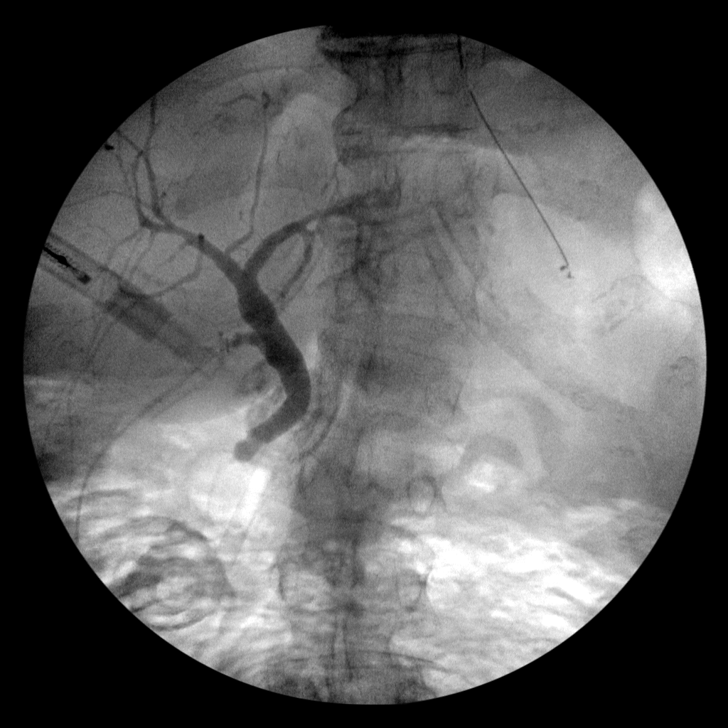
[im 2/2]
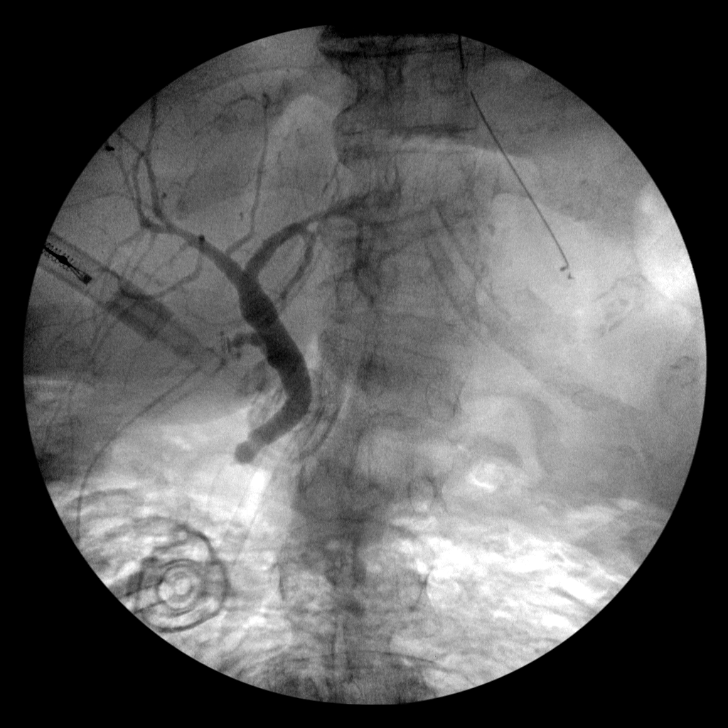
[im 2/2]
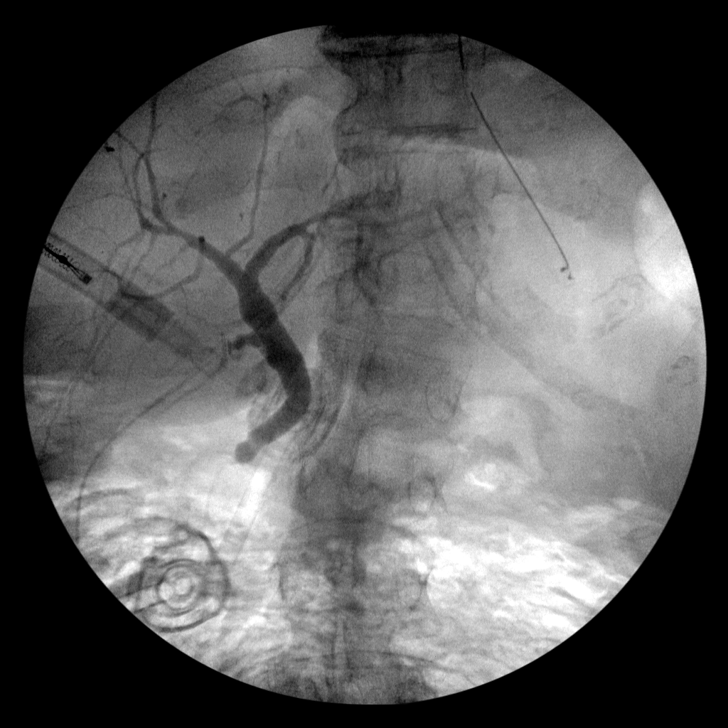
[im 2/2]
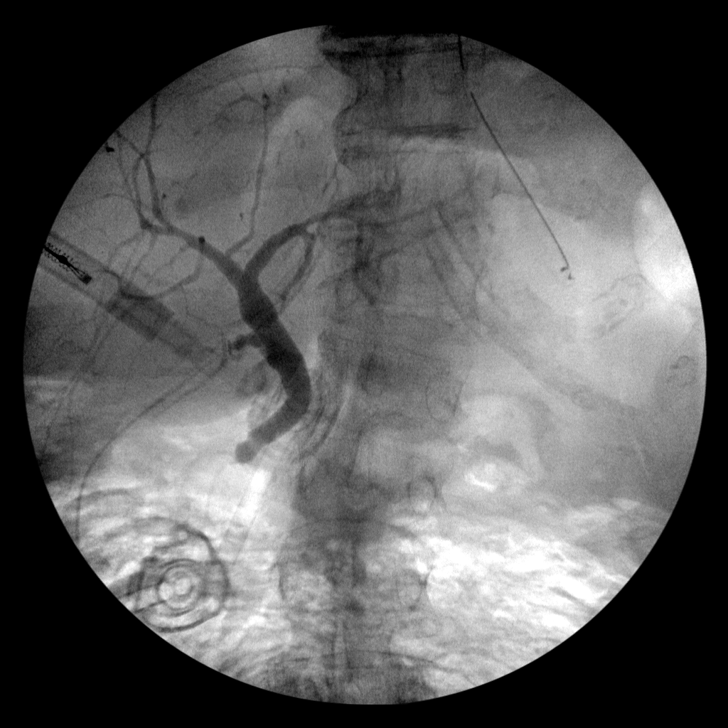

[8 of 8 positions shown; findings below may reference images not displayed]

FINDINGS: Several intraoperative saved images were obtained during
intraoperative cholangiogram at the time of laparoscopic
cholecystectomy. The images demonstrate cannulation of the cystic
duct remanent and opacification of the biliary tree. No significant
biliary ductal dilatation, stenosis, stricture or convincing
evidence of choledocholithiasis. However, there is no definite
passage of contrast material through the ampulla.
IMPRESSION: 1. No definite passage of contrast material through the ampulla and
into the duodenum. Ampullary dysfunction, stenosis or stricture is
difficult to exclude entirely.
2. Otherwise, negative intraoperative cholangiogram. No significant
biliary ductal dilatation or evidence of choledocholithiasis.

## 2021-07-18 ENCOUNTER — Emergency Department (HOSPITAL_COMMUNITY)
Admission: EM | Admit: 2021-07-18 | Discharge: 2021-07-18 | Disposition: A | Discharge status: Home / Self Care | Payer: Medicare (Managed Care) | Attending: Emergency Medicine | Admitting: Emergency Medicine

## 2021-07-18 ENCOUNTER — Encounter (HOSPITAL_COMMUNITY): Payer: Self-pay

## 2021-07-18 ENCOUNTER — Other Ambulatory Visit: Payer: Self-pay

## 2021-07-18 DIAGNOSIS — U071 COVID-19: Secondary | ICD-10-CM

## 2021-07-18 DIAGNOSIS — Z79899 Other long term (current) drug therapy: Secondary | ICD-10-CM | POA: Diagnosis not present

## 2021-07-18 DIAGNOSIS — I10 Essential (primary) hypertension: Secondary | ICD-10-CM | POA: Diagnosis not present

## 2021-07-18 DIAGNOSIS — Z7982 Long term (current) use of aspirin: Secondary | ICD-10-CM | POA: Diagnosis not present

## 2021-07-18 DIAGNOSIS — R059 Cough, unspecified: Secondary | ICD-10-CM | POA: Diagnosis present

## 2021-07-18 LAB — RESP PANEL BY RT-PCR (FLU A&B, COVID) ARPGX2
Influenza A by PCR: NEGATIVE
Influenza B by PCR: NEGATIVE
SARS Coronavirus 2 by RT PCR: POSITIVE — AB

## 2021-07-18 MED ORDER — NIRMATRELVIR/RITONAVIR (PAXLOVID)TABLET: 3.0000 | ORAL_TABLET | Freq: Two times a day (BID) | ORAL | 0 refills | Status: AC

## 2021-07-18 NOTE — ED Provider Notes (Signed)
Emergency Medicine Provider Triage Evaluation Note  Jody Cordova , a 80 y.o. female  was evaluated in triage.  Pt complains of positive COVID test.  She states that both her and her daughter today COVID home test today and hers was positive however her daughter's was negative.  She states that she wants a COVID test to make sure that it is in fact positive.  She does complain of a cough that began yesterday however she denies fevers, sore throat, shortness of breath, chest pain, headache, congestion or rhinorrhea.  Review of Systems  Positive: See above Negative:  Physical Exam  BP (!) 144/81 (BP Location: Left Arm)    Pulse 96    Temp 99.1 F (37.3 C) (Oral)    Resp 16    Ht 5\' 7"  (1.702 m)    Wt 80.7 kg    SpO2 97%    BMI 27.88 kg/m  Gen:   Awake, no distress   Resp:  Normal effort  MSK:   Moves extremities without difficulty  Other:    Medical Decision Making  Medically screening exam initiated at 6:38 PM.  Appropriate orders placed.  Christie Viscomi was informed that the remainder of the evaluation will be completed by another provider, this initial triage assessment does not replace that evaluation, and the importance of remaining in the ED until their evaluation is complete.     Lennox Pippins, PA-C 07/18/21 1843    07/20/21, MD 07/30/21 2004

## 2021-07-18 NOTE — ED Provider Notes (Signed)
[facility] DEPT Provider Note   CSN: [id number] Arrival date & time: 07/18/21  1747     History Chief Complaint  Patient presents with   Covid Positive    Jody Cordova is a 80 y.o. female.  HPI Patient is an 80 year old female with a history of GERD, hypertension, migraine, who presents to the emergency department requesting a COVID-19 test.  Patient states that Jody Cordova was recently around her family for the holidays and one of her relatives tested positive for COVID-19.  Jody Cordova states Jody Cordova had a mild dry cough this morning which has since resolved.  Jody Cordova took a home COVID-19 test which was positive and came to the emergency department for confirmatory testing.  Denies any chest pain, shortness of breath, headache, sore throat, rhinorrhea, abdominal pain, nausea, vomiting, diarrhea.  Jody Cordova has been vaccinated for COVID-19 x4.    Past Medical History:  Diagnosis Date   GERD (gastroesophageal reflux disease)    Hypertension    Migraine     Patient Active Problem List   Diagnosis Date Noted   Bacteremia 03/31/2018   Pancreatitis 03/29/2018   GERD (gastroesophageal reflux disease) 03/29/2018   Essential hypertension 03/29/2018   Migraine 03/29/2018    Past Surgical History:  Procedure Laterality Date   CHOLECYSTECTOMY N/A 03/30/2018   Procedure: LAPAROSCOPIC CHOLECYSTECTOMY WITH INTRAOPERATIVE CHOLANGIOGRAM;  Surgeon: Leighton Ruff, MD;  Location: [facility] ORS;  Service: General;  Laterality: N/A;   HERNIA REPAIR     hiatal hernia, umbilical hernia     OB History   No obstetric history on file.     History reviewed. No pertinent family history.  Social History   Tobacco Use   Smoking status: Never   Smokeless tobacco: Never  Vaping Use   Vaping Use: Never used  Substance Use Topics   Alcohol use: Never   Drug use: Never    Home Medications Prior to Admission medications   Medication Sig Start Date End Date Taking? Authorizing Provider   nirmatrelvir/ritonavir EUA (PAXLOVID) 20 x 150 MG & 10 x 100MG TABS Take 3 tablets by mouth 2 (two) times daily for 5 days. Patient GFR is >60. Take nirmatrelvir (150 mg) two tablets twice daily for 5 days and ritonavir (100 mg) one tablet twice daily for 5 days. 07/18/21 07/23/21 Yes Rayna Sexton, PA-C  alendronate (FOSAMAX) 70 MG tablet Take 70 mg by mouth every Monday. Take with a full glass of water on an empty stomach.    [provider]  alum & mag hydroxide-simeth (MAALOX/MYLANTA) 200-200-20 MG/5ML suspension Take 30 mLs by mouth every 6 (six) hours as needed for indigestion or heartburn.    [provider]  aspirin EC 81 MG tablet Take 81 mg by mouth daily.    [provider]  cefdinir (OMNICEF) 300 MG capsule Take 1 capsule (300 mg total) by mouth every 12 (twelve) hours. 04/01/18   Shelly Coss, MD  cetirizine (ZYRTEC) 10 MG tablet Take 10 mg by mouth daily.    [provider]  famotidine (PEPCID) 20 MG tablet Take 1 tablet (20 mg total) by mouth 2 (two) times daily. 04/01/18   Shelly Coss, MD  fluticasone (FLONASE) 50 MCG/ACT nasal spray Place 1 spray into both nostrils daily as needed for allergies or rhinitis.    [provider]  hydrOXYzine (ATARAX/VISTARIL) 25 MG tablet Take 25 mg by mouth 2 (two) times daily as needed for anxiety or itching.    [provider]  lisinopril-hydrochlorothiazide Reita May)  20-25 MG tablet Take 1 tablet by mouth daily.    [provider]  meclizine (ANTIVERT) 12.5 MG tablet Take 12.5 mg by mouth 3 (three) times daily as needed for dizziness.    [provider]  pantoprazole (PROTONIX) 40 MG tablet Take 40 mg by mouth daily.    [provider]  verapamil (CALAN) 40 MG tablet Take 40 mg by mouth daily.    [provider]    Allergies    Patient has no known allergies.  Review of Systems   Review of Systems  Constitutional:  Negative for chills  and fever.  HENT:  Negative for congestion, rhinorrhea and sore throat.   Respiratory:  Positive for cough. Negative for shortness of breath.   Cardiovascular:  Negative for chest pain.  Gastrointestinal:  Negative for abdominal pain, diarrhea, nausea and vomiting.   Physical Exam Updated Vital Signs BP (!) 144/81 (BP Location: Left Arm)    Pulse 96    Temp 99.1 F (37.3 C) (Oral)    Resp 16    Ht 5' 7" (1.702 m)    Wt 80.7 kg    SpO2 97%    BMI 27.88 kg/m   Physical Exam Vitals and nursing note reviewed.  Constitutional:      General: Jody Cordova is not in acute distress.    Appearance: Normal appearance. Jody Cordova is not ill-appearing, toxic-appearing or diaphoretic.  HENT:     Head: Normocephalic and atraumatic.     Right Ear: External ear normal.     Left Ear: External ear normal.     Nose: Nose normal.     Mouth/Throat:     Mouth: Mucous membranes are moist.     Pharynx: Oropharynx is clear. No oropharyngeal exudate or posterior oropharyngeal erythema.  Eyes:     Extraocular Movements: Extraocular movements intact.  Cardiovascular:     Rate and Rhythm: Normal rate and regular rhythm.     Pulses: Normal pulses.     Heart sounds: Normal heart sounds. No murmur heard.   No friction rub. No gallop.     Comments: RRR without M/R/G. Pulmonary:     Effort: Pulmonary effort is normal. No respiratory distress.     Breath sounds: Normal breath sounds. No stridor. No wheezing, rhonchi or rales.     Comments: LCTAB. Abdominal:     General: Abdomen is flat.     Tenderness: There is no abdominal tenderness.     Comments: Abdomen is flat, soft, and nontender.  Musculoskeletal:        General: Normal range of motion.     Cervical back: Normal range of motion and neck supple. No tenderness.  Skin:    General: Skin is warm and dry.  Neurological:     General: No focal deficit present.     Mental Status: Jody Cordova is alert and oriented to person, place, and time.  Psychiatric:        Mood and Affect:  Mood normal.        Behavior: Behavior normal.    ED Results / Procedures / Treatments   Labs (all labs ordered are listed, but only abnormal results are displayed) Labs Reviewed  RESP PANEL BY RT-PCR (FLU A&B, COVID) [id number] - Abnormal; Notable for the following components:      Result Value   SARS Coronavirus 2 by RT PCR POSITIVE (*)    All other components within normal limits   EKG None  Radiology No results found.  Procedures Procedures  Medications Ordered in ED Medications - No data to display  ED Course  I have reviewed the triage vital signs and the nursing notes.  Pertinent labs & imaging results that were available during my care of the patient were reviewed by me and considered in my medical decision making (see chart for details).    MDM Rules/Calculators/A&P                          Patient is an 80 year old female whose been vaccinated for COVID-19 x4 who presents to the emergency department due to a dry cough that began earlier today.  Patient states Jody Cordova has been vaccinated for COVID-19 x4.  Jody Cordova notes a recent contact who just tested positive for COVID-19.  On my exam heart is regular rate and rhythm.  No murmurs, rubs, or gallops.  Lungs are clear to auscultation bilaterally.  Abdomen is soft and nontender.  Besides a dry cough this morning, patient has no other complaints.  Jody Cordova states her cough has since resolved.  Patient found to be positive for COVID-19.  Jody Cordova is afebrile and nontoxic-appearing.  Given her age and significant comorbidities will discharge on a course of Paxlovid.  Per record review, patient's last CMP on September 25 of this year shows a creatinine of 0.9 with a EGFR of 77.  Previous BMP on August 6 of this year shows a creatinine of 0.8 with an EGFR of 89.  Feel that the patient is stable for discharge at this time and Jody Cordova is agreeable.  Recommended that patient continue to quarantine based on current CDC guidelines.  Discussed return  precautions.  Her questions were answered and Jody Cordova was amicable at the time of discharge.  Final Clinical Impression(s) / ED Diagnoses Final diagnoses:  [id number]   Rx / DC Orders ED Discharge Orders          Ordered    nirmatrelvir/ritonavir EUA (PAXLOVID) 20 x 150 MG & 10 x 100MG TABS  2 times daily        07/18/21 2036             Rayna Sexton, PA-C 07/18/21 2047    Daleen Bo, MD 07/19/21 [phone]

## 2021-07-18 NOTE — Discharge Instructions (Addendum)
I am prescribing you an antiviral medication called Paxlovid.  This medication was created by [employer] to help treat COVID-19.  It is been shown to decrease hospitalizations due to COVID-19.  If you develop any new or worsening symptoms please come back to the emergency department.  Please follow-up with your regular doctor regarding your diagnosis today.

## 2021-07-18 NOTE — ED Triage Notes (Signed)
Patient states her daughter did a home Covid test on her today and it was positive. Patient states she wants a Covid test to make sure. Patient c/o a non productive cough since yesterday. Patient denies fever , headache, sore throat, or SOB.
